# Patient Record
Sex: Male | Born: 1945 | Race: White | Hispanic: No | Marital: Married | Smoking: Former smoker
Health system: Southern US, Community
[De-identification: ages and names within clinical notes are randomized; demographics above are authoritative.]

## PROBLEM LIST (undated history)

## (undated) DIAGNOSIS — N189 Chronic kidney disease, unspecified: Secondary | ICD-10-CM

## (undated) DIAGNOSIS — K259 Gastric ulcer, unspecified as acute or chronic, without hemorrhage or perforation: Secondary | ICD-10-CM

## (undated) HISTORY — PX: LITHOTRIPSY: SUR834

## (undated) HISTORY — PX: BLADDER TUMOR EXCISION: SHX238

---

## 1999-02-28 ENCOUNTER — Ambulatory Visit (HOSPITAL_COMMUNITY): Admission: RE | Admit: 1999-02-28 | Discharge: 1999-02-28 | Payer: Self-pay | Admitting: Urology

## 1999-02-28 ENCOUNTER — Encounter: Payer: Self-pay | Admitting: Urology

## 1999-09-26 ENCOUNTER — Ambulatory Visit (HOSPITAL_COMMUNITY): Admission: RE | Admit: 1999-09-26 | Discharge: 1999-09-26 | Payer: Self-pay | Admitting: Gastroenterology

## 1999-09-26 ENCOUNTER — Encounter (INDEPENDENT_AMBULATORY_CARE_PROVIDER_SITE_OTHER): Payer: Self-pay

## 2002-10-10 ENCOUNTER — Encounter (INDEPENDENT_AMBULATORY_CARE_PROVIDER_SITE_OTHER): Payer: Self-pay | Admitting: Specialist

## 2002-10-10 ENCOUNTER — Ambulatory Visit (HOSPITAL_COMMUNITY): Admission: RE | Admit: 2002-10-10 | Discharge: 2002-10-10 | Payer: Self-pay | Admitting: Gastroenterology

## 2010-02-01 ENCOUNTER — Ambulatory Visit
Admission: RE | Admit: 2010-02-01 | Discharge: 2010-02-01 | Payer: Self-pay | Source: Home / Self Care | Admitting: Urology

## 2010-05-31 LAB — POCT HEMOGLOBIN-HEMACUE: Hemoglobin: 16.1 g/dL (ref 13.0–17.0)

## 2010-08-05 NOTE — Procedures (Signed)
[place]  Patient:    Brent Butler, Brent Butler                        MRN: [id number] Proc. Date: 09/26/99 Attending:  Petra Kuba, M.D. CC:         Petra Kuba, M.D.             Delorse Lek, M.D.                           Procedure Report  PROCEDURE PERFORMED:  Colonoscopy with polypectomy.  ENDOSCOPIST:  Petra Kuba, M.D.  INDICATIONS FOR PROCEDURE:  Patient with polyp on flexible sigmoidoscopy. Consent was signed after risks, benefits, methods, and options were thoroughly discussed with the patient prior to any premeds given.  MEDICATIONS USED:  Demerol 50 mg, Versed 5 mg.  DESCRIPTION OF PROCEDURE:  Rectal inspection was pertinent for external hemorrhoids, small.  Digital exam was negative.  Video colonoscope was inserted in the rectum.  An obvious small polyp was seen.  This was probably the one that Dr. Doristine Counter saw.  The scope was then easily inserted around the colon to the cecum.  This did require rolling him on his back and some left lower quadrant pressure but no other position changes.  The cecum was identified by the appendiceal orifice and the ileocecal valve.  On insertion, there was some left-sided diverticula seen but no other obvious polypoid lesions or masses.  Prep on the right side was only fair, The rest of the prep was adequate.  The scope was inserted a short ways into the terminal ileum which was normal.  Photodocumentation was obtained.  The scope was slowly withdrawn.  It took lots of washing and suctioning of the right side of the colon but some stool was still stuck to the wall.  Two ascending small polyps were seen and were carefully hot biopsied on a setting of 20/20.  The scope was further withdrawn and two additional polyps in the midtransverse were seen and were hot biopsied as well.  All those polyps were put in the first container.  The scope was further withdrawn.  Some left-sided moderate diverticula  were seen.  As the scope was withdrawn back to the rectum four small polyps were seen.  These were all snared, electrocautery applied and the polyps were suctioned through the scope and collected in the trap and put in the second container.  One polyp did require hot biopsying of the edge x 2 to make sure we removed it completely.  The scope was retroflexed pertinent for some internal hemorrhoids.  The scope was straightened, air was suctioned, scope removed.  The patient tolerated the procedure well.  There was no obvious immediate complication.  ENDOSCOPIC DIAGNOSIS: 1. Internal and external hemorrhoids. 2. Left-sided diverticula. 3. Four rectal polyps status post snare and hot biopsies at one of the bases.    All of these were small. 4. Four tiny to small right-sided polyps hot biopsied, put in the first    container. 4. Otherwise within normal limits to the terminal ileum.  PLAN:  Await pathology to determine future colonic screening, probably use GoLYTELY next time.  ____________ postpolypectomy instructions.  Probably repeat colon screening in three years, await on biopsy.  GI follow-up p.r.n. Otherwise return care to Dr. Doristine Counter for customary yearly rectals and guaiacs and will go ahead and give him some diverticula  information. DD:  09/26/99 TD:  09/26/99 Job: [id number]

## 2010-08-05 NOTE — Op Note (Signed)
NAME:  Brent Butler, Brent Butler                         ACCOUNT NO.:  [id number]   MEDICAL RECORD NO.:  [id number]                   PATIENT TYPE:  AMB   LOCATION:  ENDO                                 FACILITY[facility]  [facility]   PHYSICIAN:  Petra Kuba, M.D.                 DATE OF BIRTH:  [birth year]   DATE OF PROCEDURE:  10/10/2002  DATE OF DISCHARGE:                                 OPERATIVE REPORT   PROCEDURE:  Colonoscopy with polypectomy.   INDICATIONS FOR PROCEDURE:  A patient with a history of colon polyps due for  repeat screening.   Consent was signed after risks, benefits, methods, and options were  thoroughly discussed in the past.   MEDICINES USED:  Demerol 80, Versed 7.5.   DESCRIPTION OF PROCEDURE:  Rectal inspection was pertinent for external  hemorrhoids, small. Digital exam was negative. The video colonoscope was  inserted, easily advanced to the splenic flexure. At that point, a small  sessile polyp was seen and was hot biopsied x2 and put in the first  container. To advance to the cecum required rolling him on his back and  various abdominal pressures. No other abnormalities were seen on insertion.  The cecum was identified by the appendiceal orifice and the ileocecal valve.  The prep was fairly adequate. Did have a moderate amount of stool that  required washing and suctioning. On slow withdrawal through the colon, the  cecum and ascending were normal. In the more mid transverse, another small  sessile polyp was seen and was hot biopsied x2 and put in the same container  with the other polyp. The scope was slowly withdrawn back to the other  polyp. There was no obvious polypoid tissue left and the electrocautery turn  was confirmed without obvious bleeding. The scope was further withdrawn.  There was some scattered left sided diverticula but no other polyps. He had  some difficulty holding air. He also had some snoring and sleep apnea spells  but otherwise no obvious  problems and no additional findings were seen as we  withdrew back to the rectum. Anorectal pull through and retroflexion  confirmed some hemorrhoids. The scope was reinserted a short ways up the  left side of the colon, air was suctioned, scope removed. The patient  tolerated the procedure adequately. There was no obvious or immediate  complications.   ENDOSCOPIC DIAGNOSIS:  1. Internal and external hemorrhoids.  2. Left sided scattered diverticula.  3. Two small polyps hot biopsied in the splenic flexure and mid transverse.  4. Otherwise within normal limits to the cecum.    PLAN:  Await pathology to determine future colonic screening. Happy to see  back p.r.n. otherwise return care to Dr. Doristine Counter for the customary health  care maintenance to including yearly rectals and guaiacs.  Petra Kuba, M.D.    MEM/MEDQ  D:  10/10/2002  T:  10/10/2002  Job:  [id number]   cc:   Marjory Lies, M.D.  [address]  [place]  [place] [address]  Fax: [phone]

## 2010-08-05 NOTE — Consult Note (Signed)
[facility]  Patient:    Brent Butler, Brent Butler                        MRN: [id number] Proc. Date: 09/26/99 Attending:  Petra Kuba, M.D. CC:         Petra Kuba, M.D.             Delorse Lek, M.D.                          Consultation Report  HISTORY OF PRESENT ILLNESS:  A patient with a bout of diverticulitis 6 weeks ago underwent screening flex sig today and a polyp was found. Other than the bout that responded to antibiotics, he has not had any GI symptoms and has not seen any blood.  PAST MEDICAL HISTORY:  Pertinent for increased cholesterol and a kidney stone requiring surgery but no other chronic medical problems.  MEDICINES:  Niacin only.  ALLERGIES:  No known allergies.  FAMILY HISTORY:  Pertinent for a grandfather with colon cancer and his dad with polyps.  SOCIAL HISTORY:  Smokes, rarely drinks. He does not use any aspirin products.  PHYSICAL EXAMINATION:  VITAL SIGNS:  See chart. In no acute distress.  LUNGS:  Clear.  HEART:  Regular rate and rhythm.  ABDOMEN:  Soft, nontender.  ASSESSMENT:  Polyp on flex sig in a patient with a family history with colon cancer and colon polyps.  PLAN:  The risks and benefits of colonoscopy were discussed and will proceed with further workup plans and future screening pending pathology, number of polyps, etc. DD:  09/26/99 TD:  09/26/99 Job: [id number]

## 2011-01-04 ENCOUNTER — Other Ambulatory Visit: Payer: Self-pay | Admitting: Gastroenterology

## 2011-10-17 ENCOUNTER — Other Ambulatory Visit: Payer: Self-pay | Admitting: Urology

## 2011-10-30 ENCOUNTER — Encounter (HOSPITAL_COMMUNITY): Payer: Self-pay | Admitting: Pharmacy Technician

## 2011-11-01 ENCOUNTER — Encounter (HOSPITAL_COMMUNITY): Payer: Self-pay | Admitting: *Deleted

## 2011-11-01 NOTE — Progress Notes (Signed)
1903 Asked to bring in the blue folder,insurance card,ID,have a driver for the day, wear comfortable clothing. Asked not to take Aspirin,Advil or Toradol 72 hours prior to his procedure. Instructed to eat a light dinner 11-08-11 and take a laxative stated his doctor said take the laxative by 3 pm. To arrive for procedure at 0530.

## 2011-11-07 ENCOUNTER — Other Ambulatory Visit: Payer: Self-pay | Admitting: Urology

## 2011-11-08 NOTE — H&P (Signed)
Patient is scheduled today for ESWL. He is a patient of Dr. Campbell Lerner who was unable To perform this procedure and asked [place] to assume this patient's care. The history and physical below is from Dr. Belva Crome recent office assessment. The patient's urine culture from November 03, 2011 was negative. Active Problems Problems  1. 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 Hypertension 401.9 3. 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 PSA,Elevated 790.93 7. Urinary Tract Infection 599.0  History of Present Illness  Brent Butler returns today in f/u from a CT scan done for cytologic atypia on a recent specimen done for his history of bladder cancer.  He has just had a negative cystoscopy.   The CT shows a right renal pelvic stone without obstruction.  He had debris in his bladder and his UA looks infected today.  He has a diverticulum on the dome and a large prostate with a thick bladder wall from BOO.  He has noted cloudy urine but no dysuria.  He has had no flank pain or hematuria.   Past Medical History Problems  1. 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 History of  Microscopic Hematuria 599.72 4. 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 History of  Peptic Ulcer V12.71 7. PSA,Elevated 790.93 8. 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 History of  Pyuria 791.9 10. Tobacco Use V15.82  Surgical History Problems  1. History of  Cystoscopy With Fulguration 2. History of  Cystoscopy With Fulguration Medium Lesion (2-5cm) 3. History of  Lithotripsy  Current Meds 1. Doxazosin Mesylate 8 MG Oral Tablet; Take 1 tablet by mouth every day; Therapy: 02Apr2008 to  (Evaluate:30Jun2014)  Requested for: 05Jul2013; Last Rx:05Jul2013 2. Viagra 100 MG  Oral Tablet; Take 1 tablet by mouth as needed; Therapy: 14Jan2008 to (Last  Rx:05Jul2013)  Requested for: 05Jul2013  Allergies Medication  1. No Known Drug Allergies  Family History Problems  1. Maternal history of  Bladder Cancer V16.52 2. Maternal history of  Death In The Family Mother Bladder CancerAge 81 3. Paternal history of  Family Health Status - Father's Age 79 yrs old 4. Family history of  Family Health Status Number Of Children 2 son 5. Family history of  Urologic Disorder V18.7 kidney stones  Social History Problems  1. Alcohol Use 1 2. Caffeine Use 2 3. Former Smoker Smoked for 56yrs and stopped for 91yrs 4. Marital History - Currently Married 5. Occupation: Airline pilot  Review of Systems  Gastrointestinal: no nausea and no flank pain.  Constitutional: no fever.  Cardiovascular: no chest pain.  Respiratory: no shortness of breath.    Physical Exam Constitutional: Well nourished and well developed . No acute distress.  Pulmonary: No respiratory distress and normal respiratory rhythm and effort.  Cardiovascular: Heart rate and rhythm are normal . No peripheral edema.  Abdomen: The abdomen is soft and nontender. No masses are palpated. No CVA tenderness. No hernias are palpable. No hepatosplenomegaly noted.    Results/Data Urine [Data Includes: Last 1 Day]   30Jul2013  COLOR YELLOW   APPEARANCE CLOUDY   SPECIFIC GRAVITY 1.015   pH 6.5   GLUCOSE NEG mg/dL  BILIRUBIN NEG   KETONE NEG mg/dL  BLOOD TRACE   PROTEIN NEG mg/dL  UROBILINOGEN 0.2 mg/dL  NITRITE POS  LEUKOCYTE ESTERASE LARGE   SQUAMOUS EPITHELIAL/HPF NONE SEEN   WBC TNTC WBC/hpf  RBC 7-10 RBC/hpf  BACTERIA MANY   CRYSTALS NONE SEEN   CASTS NONE SEEN   Selected Results  BUN & CREATININE 25Jul2013 08:36AM Bjorn Pippin  SPECIMEN TYPE: BLOOD   Test Name Result Flag Reference  CREATININE 1.00 mg/dL  1.61-0.96  BUN 14 mg/dL  0-45  Est GFR, African American >89 mL/min    Est GFR, NonAfrican  American 78 mL/min    Performed at:        [facility]                      [address] [place]                      [place], [place] [address]   The estimated GFR is a calculation valid for adults (77 to 67 years old) that uses the CKD-EPI algorithm to adjust for age and sex. It is not to be used for children, pregnant women, hospitalized patients, patients on dialysis, or with rapidly changing kidney function. According to the NKDEP, eGFR >89 is normal, 60-89 shows mild impairment, 30-59 shows moderate impairment, 15-29 shows severe impairment and <15 is ESRD.   AU CT-HEMATURIA PROTOCOL 25Jul2013 12:00AM Bjorn Pippin   Test Name Result Flag Reference  ** RADIOLOGY REPORT BY [facility] ** ORIGINAL APPROVED BY: Genevive Bi, M.D. ON: 10/12/2011 10:30:15   *RADIOLOGY REPORT*  Clinical Data: Bladder cancer  CT ABDOMEN AND PELVIS WITHOUT AND WITH CONTRAST  Technique: Multidetector CT imaging of the abdomen and pelvis was performed without contrast material in one or both body regions, followed by contrast material(s) and further sections in one or both body regions.  Contrast: 125 ml Isovue  Comparison: CT 12/28/2009  Findings:  Renal: Non-IV contrast images demonstrate an 8 mm calculus within the right renal pelvis. No distal ureterolithiasis. Cortical phase imaging demonstrates no enhancing renal cortical lesion. Pyelogram phase imaging demonstrates no evidence of ureteral obstruction or right renal obstruction from the calculus. There is mild thickening in the right pelvis adjacent to the calculus. There are no filling defects with the distal ureters or collecting systems . There is some debris floating within the bladder at the urine contrast interface seen on sagittal image 94. There is a diverticulum in the dome of the bladder measuring 16 mm. There is some dependent debris within the bladder additionally.  Lung bases are clear. There is a  enhancing lesion in the anterior aspect of the central left hepatic lobe which is unchanged in size from prior and has imaging characters most consistent with a benign hemangioma. The gallbladder, pancreas, spleen, adrenal glands are normal. The stomach, small bowel, appendix, and cecum are normal. Multiple diverticula throughout the colon without acute inflammation. The rectum is normal.  Abdominal aorta normal caliber. No retroperitoneal periportal lymphadenopathy.  The prostate gland is enlarged and indents the base of the bladder. No pelvic lymphadenopathy. Review of bone windows demonstrates no aggressive osseous lesions.  IMPRESSION:  1.. No evidence of bladder cancer recurrence or metastasis. 2. Debris within the bladder with some floating at the fluid fluid interface and some dependent debris. 3. Diverticulum in the dome the bladder. 4. Nonobstructing large calculus within the right renal pelvis. 5. Prostate hypertrophy. 6. Incidental findings include the hepatic hemangioma and diverticulosis.   URINE CYTOLOGY 15Jul2013 09:47AM Bjorn Pippin  SPECIMEN TYPEBrigid Re CATCH   Test Name Result  Flag Reference  FINAL DIAGNOSIS:  A   - CELLS PRESENT SUSPICIOUS FOR MALIGNANCY. - RULE OUT UROTHELIAL NEOPLASIA. WBC's are present, Mild.  Urine: Not Otherwise Specified  1 VIAL RCVD 90CC OF YELLOW CC URI 1 SLIDE PREPARED [id number] LW  Relevant Clinical Info     PMH: MALIGNANT NEOPLASM OF THE LATERAL WALL OF THE BLADDER  PATHOLOGIST:     Reviewed by Dallas Breeding, MD, PhD, FCAP (Electronic Signature on File)  1 Container Submitted  CYTOTECHNOLOGIST:     Shamicka L. Tyler Deis; MS, CT (ASCP)   Assessment Assessed  1. Urinary Tract Infection 599.0 2. Nephrolithiasis Right 592.0   Alexie has a non-obstructing 8mm right renal pelvic stone and has a minimally symptomatic UTI.   Plan Health Maintenance (V70.0)  1. UA With REFLEX  Done: 30Jul2013 10:35AM Nephrolithiasis  (592.0)  2. Oxycodone-Acetaminophen 5-325 MG Oral Tablet; take 1 or 2 tablets q 4-6 hours prn pain;  Therapy: 30Jul2013 to (Last Rx:30Jul2013) 3. Follow-up Schedule Surgery Office  Follow-up  Requested for: 30Jul2013 Urinary Tract Infection (599.0)  4. URINE CULTURE  Requested for: 30Jul2013 Urinary Tract Infection (599.0), Nephrolithiasis (592.0)  5. Sulfamethoxazole-TMP DS 800-160 MG Oral Tablet; TAKE 1 TABLET BID; Therapy: 30Jul2013 to  (Complete:09Aug2013); Last Rx:30Jul2013   Urine culture today. I am going to start therapy and I will set him up for ESWL for the right renal pelvic stone but I will want him well under treatment for the UTI before proceeding.  I have reviewed the risks of the ESWL including bleeding, infection, renal and adjacent organ injury, obstructing fragments with need for secondary procedures, thrombotic events and sedation risks.

## 2011-11-09 ENCOUNTER — Encounter (HOSPITAL_COMMUNITY): Payer: Self-pay | Admitting: *Deleted

## 2011-11-09 ENCOUNTER — Ambulatory Visit (HOSPITAL_COMMUNITY)
Admission: RE | Admit: 2011-11-09 | Discharge: 2011-11-09 | Disposition: A | Discharge status: Home / Self Care | Payer: Medicare Other | Source: Ambulatory Visit | Attending: Urology | Admitting: Urology

## 2011-11-09 ENCOUNTER — Ambulatory Visit (HOSPITAL_COMMUNITY): Payer: Medicare Other

## 2011-11-09 ENCOUNTER — Encounter (HOSPITAL_COMMUNITY)
Admission: RE | Disposition: A | Discharge status: Home / Self Care | Payer: Self-pay | Source: Ambulatory Visit | Attending: Urology

## 2011-11-09 DIAGNOSIS — N138 Other obstructive and reflux uropathy: Secondary | ICD-10-CM | POA: Insufficient documentation

## 2011-11-09 DIAGNOSIS — I10 Essential (primary) hypertension: Secondary | ICD-10-CM | POA: Insufficient documentation

## 2011-11-09 DIAGNOSIS — N401 Enlarged prostate with lower urinary tract symptoms: Secondary | ICD-10-CM | POA: Insufficient documentation

## 2011-11-09 DIAGNOSIS — N390 Urinary tract infection, site not specified: Secondary | ICD-10-CM | POA: Insufficient documentation

## 2011-11-09 DIAGNOSIS — R972 Elevated prostate specific antigen [PSA]: Secondary | ICD-10-CM | POA: Insufficient documentation

## 2011-11-09 DIAGNOSIS — Z79899 Other long term (current) drug therapy: Secondary | ICD-10-CM | POA: Insufficient documentation

## 2011-11-09 DIAGNOSIS — Z8551 Personal history of malignant neoplasm of bladder: Secondary | ICD-10-CM | POA: Insufficient documentation

## 2011-11-09 DIAGNOSIS — N200 Calculus of kidney: Secondary | ICD-10-CM

## 2011-11-09 HISTORY — DX: Chronic kidney disease, unspecified: N18.9

## 2011-11-09 SURGERY — LITHOTRIPSY, ESWL
Anesthesia: LOCAL | Laterality: Right

## 2011-11-09 MED ORDER — CIPROFLOXACIN HCL 500 MG PO TABS
500.0000 mg | ORAL_TABLET | ORAL | Status: AC
  Administered 2011-11-09: 500 mg via ORAL
  Filled 2011-11-09: qty 1

## 2011-11-09 MED ORDER — DEXTROSE-NACL 5-0.45 % IV SOLN
INTRAVENOUS | Status: DC
  Administered 2011-11-09: 07:00:00 via INTRAVENOUS

## 2011-11-09 MED ORDER — DIPHENHYDRAMINE HCL 25 MG PO CAPS
25.0000 mg | ORAL_CAPSULE | ORAL | Status: AC
  Administered 2011-11-09: 25 mg via ORAL
  Filled 2011-11-09: qty 1

## 2011-11-09 MED ORDER — DIAZEPAM 5 MG PO TABS
10.0000 mg | ORAL_TABLET | ORAL | Status: AC
  Administered 2011-11-09: 10 mg via ORAL
  Filled 2011-11-09: qty 2

## 2011-11-09 NOTE — Op Note (Signed)
See [facility] OP note scanned into chart. 

## 2011-11-10 ENCOUNTER — Encounter (HOSPITAL_COMMUNITY): Payer: Self-pay

## 2013-09-03 ENCOUNTER — Other Ambulatory Visit: Payer: Self-pay | Admitting: Urology

## 2013-09-05 ENCOUNTER — Encounter (HOSPITAL_BASED_OUTPATIENT_CLINIC_OR_DEPARTMENT_OTHER): Payer: Self-pay | Admitting: *Deleted

## 2013-09-05 NOTE — Progress Notes (Signed)
To [facility] at 0600- Hg and Ekg on arrival.Npo after Mn.

## 2013-09-10 ENCOUNTER — Encounter (HOSPITAL_BASED_OUTPATIENT_CLINIC_OR_DEPARTMENT_OTHER): Payer: Self-pay | Admitting: Anesthesiology

## 2013-09-10 NOTE — H&P (Signed)
Active Problems Problems  1. Benign prostatic hyperplasia with urinary obstruction (600.01,599.69) 2. Elevated prostate specific antigen (PSA) (790.93) 3. 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 History of kidney stones (V13.01) 5. Hypertension (401.9) 6. 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 Pyuria (791.9)  History of Present Illness Brent Butler returns today in f/u. He was seen in May for cystoscopy but had a UTI which has since been treated. He has minimal pyuria today which is routine for him. He has had no hematuria or other voiding complaints. The Viagra is working again for him since treatment of the UTI.      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 He was given Mitomycin C then. He has had no recurrences and his last cystoscopy in May 2014 was negative and his most recent cytology in the fall was benign. He also has a history of stones and UTI's.  He has a history of BPH with BOO and an elevated PSA with prior prostatitis. HisPSA in 11/14 was 8.04 which is fairly stable.  He has declined biopsy and is voiding well on doxazosin.      Past Medical History Problems  1. Benign prostatic hyperplasia with urinary obstruction (600.01,599.69) 2. Elevated prostate specific antigen (PSA) (790.93) 3. 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 History of diverticulitis of colon (V12.79) 5. History of kidney stones (V13.01) 6. History of Impotence, psychogenic (302.72) 7. 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 History of Microscopic hematuria (599.72) 9. History of Peptic Ulcer (V12.71) 10. History of Pyuria (791.9) 11. History of Urinary Tract Infection (V13.02)  Surgical History Problems  1. History of Cystoscopy With Fulguration 2. History of Cystoscopy With Fulguration Medium Lesion (2-5cm) 3. History of Lithotripsy 4. History of Lithotripsy  Current Meds 1.  Doxazosin Mesylate 8 MG Oral Tablet; Take 1 tablet daily;  Therapy: 25Oct2013 to (Evaluate:05Sep2015); Last Rx:10Sep2014 Ordered 2. Viagra 100 MG Oral Tablet; Take 1 tablet as needed;  Therapy: 25Oct2013 to (Last Rx:25Oct2013) Ordered  Allergies Medication  1. No Known Drug Allergies  Family History Problems  1. Family history of Bladder Cancer (E99.37) : Mother 2. Family history of Death In The Family Mother : Mother   Bladder CancerAge 81 3. Family history of Family Health Status - Father's Age : Father   30 yrs old 62. Family history of Family Health Status Number Of Children   2 son 5. Family history of Urologic Disorder (V18.7)   kidney stones  Social History Problems  1. Alcohol Use   1 2. Caffeine Use   2 3. Former Smoker   Smoked for 56yrs and stopped for 34yrs 4. 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 Tobacco Use (V15.82)   smoked 1 pack a day for 35 years  Past and social history reviewed and updated.   Review of Systems  Genitourinary: no hematuria.  Gastrointestinal: no flank pain.  Constitutional: no fever.    Vitals Vital Signs [Data Includes: Last 1 Day]  Recorded: [id number] 09:33AM  Blood Pressure: 171 / 87 Temperature: 97.5 F Heart Rate: 87  Physical Exam Constitutional: Well nourished and well developed . No acute distress.  Pulmonary: No respiratory distress and normal respiratory rhythm and effort.  Cardiovascular: Heart rate and rhythm are normal . No peripheral edema.    Results/Data Urine [Data Includes: Last 1 Day]   [id number]  COLOR YELLOW  APPEARANCE CLEAR   SPECIFIC GRAVITY 1.015   pH 6.5   GLUCOSE NEG mg/dL  BILIRUBIN NEG   KETONE NEG mg/dL  BLOOD TRACE   PROTEIN TRACE mg/dL  UROBILINOGEN 0.2 mg/dL  NITRITE NEG   LEUKOCYTE ESTERASE MOD   SQUAMOUS EPITHELIAL/HPF NONE SEEN   WBC 7-10 WBC/hpf  RBC 0-2 RBC/hpf  BACTERIA NONE SEEN   CRYSTALS NONE SEEN   CASTS NONE  SEEN   Selected Results  URINE CULTURE [id number] 01:47PM Brent Butler  SOURCE : CLEAN CATCH SPECIMEN TYPE: URINE   Test Name Result Flag Reference  CULTURE, URINE Culture, Urine    ===== COLONY COUNT: =====  >=100,000 COLONIES/ML   FINAL REPORT: STAPHYLOCOCCUS SPECIES (COAGULASE NEGATIVE)  RIFAMPIN AND GENTAMICIN SHOULD NOT BE USED AS SINGLE DRUGS FOR TREATMENT OF STAPH INFECTIONS.   SENSITIVITY FOR: STAPHYLOCOCCUS SPECIES (COAGULASE NEGATIVE)    PENICILLIN                             RESISTANT      >=0.5    OXACILLIN                              RESISTANT        >=4    CEFAZOLIN                              RESISTANT               GENTAMICIN                             SENSITIVE      <=0.5    CIPROFLOXACIN                          RESISTANT        >=8    LEVOFLOXACIN                           RESISTANT        >=8    NITROFURANTOIN                         SENSITIVE       <=16    TRIMETH/SULFA                          SENSITIVE       <=10    VANCOMYCIN                             SENSITIVE          1    RIFAMPIN                               SENSITIVE      <=0.5    TETRACYCLINE                           RESISTANT       >=16    END OF REPORT   Procedure  Procedure: Cystoscopy   Indication: History  of Urothelial Carcinoma.  Informed Consent: Risks, benefits, and potential adverse events were discussed and informed consent was obtained from the patient.  Prep: The patient was prepped with betadine.  Antibiotic prophylaxis: Ciprofloxacin.  Procedure Note:  Urethral meatus:. No abnormalities.  Anterior urethra: No abnormalities.  Prostatic urethra: No abnormalities . Estimated length was 4 cm. There was visual obstruction of the prostatic urethra. The lateral and median prostatic lobes were enlarged. An enlarged intravesical median lobe was visualized.  Bladder: Visulization was clear. The ureteral orifices were in the normal anatomic position bilaterally and had clear efflux of  urine. A systematic survey of the bladder demonstrated no bladder tumors or stones. The mucosa was smooth without abnormalities. Examination of the bladder demonstrated mild trabeculation and a diverticulum located on the posterior aspect of the bladder measuring approximately 1 cm (with some erythema on the floor that is most suspicious for inflammation but CIS is a possibility). The patient tolerated the procedure well.  Complications: None.    Assessment Assessed  1. 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 Lesion of bladder (596.9) 3. Pyuria (791.9)  He has an area of erythema in a small diverticulum on the posterior wall that is worrisome for CIS vs inflammation.   Plan Health Maintenance  1. UA With REFLEX; [Do Not Release]; Status:Resulted - Requires Verification;   Done:  [id number] 09:16AM Malignant neoplasm of lateral wall of bladder  2. Follow-up Schedule Surgery Office  Follow-up  Status: Hold For - Appointment   Requested for: [phone] Pyuria  3. URINE CULTURE; Status:In Progress - Specimen/Data Collected;   Done: [phone]  I am going to set him up for a biopsy and fulguration and reviewed the risks of bleeding, infection, bladder wall injury possibly requiring surgical repair or catheter drainage, thrombotic events and anesthetic complications.   Urine culture today for the residual pyuria.   Discussion/Summary CC: Dr. Juanita Craver.

## 2013-09-10 NOTE — Anesthesia Preprocedure Evaluation (Addendum)
Anesthesia Evaluation  Patient identified by MRN, date of birth, ID band Patient awake    Reviewed: Allergy & Precautions, H&P , NPO status , Patient's Chart, lab work & pertinent test results  Airway Mallampati: II TM Distance: >3 FB Neck ROM: Full    Dental no notable dental hx. (+) Upper Dentures, Dental Advisory Given   Pulmonary former smoker,  breath sounds clear to auscultation  Pulmonary exam normal       Cardiovascular Exercise Tolerance: Good negative cardio ROS  Rhythm:Regular Rate:Normal  ECG: inferior Q waves in III and avF   Neuro/Psych negative neurological ROS  negative psych ROS   GI/Hepatic Neg liver ROS, PUD,   Endo/Other  negative endocrine ROS  Renal/GU Renal disease  negative genitourinary   Musculoskeletal negative musculoskeletal ROS (+)   Abdominal   Peds negative pediatric ROS (+)  Hematology negative hematology ROS (+)   Anesthesia Other Findings   Reproductive/Obstetrics negative OB ROS                        Anesthesia Physical Anesthesia Plan  ASA: II  Anesthesia Plan: General   Post-op Pain Management:    Induction: Intravenous  Airway Management Planned: LMA  Additional Equipment:   Intra-op Plan:   Post-operative Plan: Extubation in OR  Informed Consent: I have reviewed the patients History and Physical, chart, labs and discussed the procedure including the risks, benefits and alternatives for the proposed anesthesia with the patient or authorized representative who has indicated his/her understanding and acceptance.   Dental advisory given  Plan Discussed with: CRNA  Anesthesia Plan Comments:         Anesthesia Quick Evaluation

## 2013-09-11 ENCOUNTER — Encounter (HOSPITAL_BASED_OUTPATIENT_CLINIC_OR_DEPARTMENT_OTHER)
Admission: RE | Disposition: A | Discharge status: Home / Self Care | Payer: Self-pay | Source: Ambulatory Visit | Attending: Urology

## 2013-09-11 ENCOUNTER — Encounter (HOSPITAL_BASED_OUTPATIENT_CLINIC_OR_DEPARTMENT_OTHER): Payer: Medicare Other | Admitting: Anesthesiology

## 2013-09-11 ENCOUNTER — Ambulatory Visit (HOSPITAL_BASED_OUTPATIENT_CLINIC_OR_DEPARTMENT_OTHER)
Admission: RE | Admit: 2013-09-11 | Discharge: 2013-09-11 | Disposition: A | Discharge status: Home / Self Care | Payer: Medicare Other | Source: Ambulatory Visit | Attending: Urology | Admitting: Urology

## 2013-09-11 ENCOUNTER — Encounter (HOSPITAL_BASED_OUTPATIENT_CLINIC_OR_DEPARTMENT_OTHER): Payer: Self-pay | Admitting: *Deleted

## 2013-09-11 ENCOUNTER — Ambulatory Visit (HOSPITAL_BASED_OUTPATIENT_CLINIC_OR_DEPARTMENT_OTHER): Payer: Medicare Other | Admitting: Anesthesiology

## 2013-09-11 DIAGNOSIS — N138 Other obstructive and reflux uropathy: Secondary | ICD-10-CM | POA: Insufficient documentation

## 2013-09-11 DIAGNOSIS — Z79899 Other long term (current) drug therapy: Secondary | ICD-10-CM | POA: Insufficient documentation

## 2013-09-11 DIAGNOSIS — N139 Obstructive and reflux uropathy, unspecified: Secondary | ICD-10-CM | POA: Insufficient documentation

## 2013-09-11 DIAGNOSIS — Z8551 Personal history of malignant neoplasm of bladder: Secondary | ICD-10-CM | POA: Insufficient documentation

## 2013-09-11 DIAGNOSIS — Z87891 Personal history of nicotine dependence: Secondary | ICD-10-CM | POA: Insufficient documentation

## 2013-09-11 DIAGNOSIS — N323 Diverticulum of bladder: Secondary | ICD-10-CM | POA: Insufficient documentation

## 2013-09-11 DIAGNOSIS — I10 Essential (primary) hypertension: Secondary | ICD-10-CM | POA: Insufficient documentation

## 2013-09-11 DIAGNOSIS — N329 Bladder disorder, unspecified: Secondary | ICD-10-CM | POA: Insufficient documentation

## 2013-09-11 DIAGNOSIS — N401 Enlarged prostate with lower urinary tract symptoms: Secondary | ICD-10-CM | POA: Insufficient documentation

## 2013-09-11 HISTORY — PX: CYSTOSCOPY: SHX5120

## 2013-09-11 HISTORY — DX: Gastric ulcer, unspecified as acute or chronic, without hemorrhage or perforation: K25.9

## 2013-09-11 LAB — POCT HEMOGLOBIN-HEMACUE: Hemoglobin: 15.4 g/dL (ref 13.0–17.0)

## 2013-09-11 SURGERY — CYSTOSCOPY
Anesthesia: General | Site: Bladder

## 2013-09-11 MED ORDER — PROPOFOL 10 MG/ML IV BOLUS
INTRAVENOUS | Status: DC | PRN
  Administered 2013-09-11: 180 mg via INTRAVENOUS
  Administered 2013-09-11: 20 mg via INTRAVENOUS

## 2013-09-11 MED ORDER — ONDANSETRON HCL 4 MG/2ML IJ SOLN
INTRAMUSCULAR | Status: DC | PRN
  Administered 2013-09-11: 4 mg via INTRAVENOUS

## 2013-09-11 MED ORDER — SODIUM CHLORIDE 0.9 % IV SOLN
250.0000 mL | INTRAVENOUS | Status: DC | PRN
  Filled 2013-09-11: qty 250

## 2013-09-11 MED ORDER — SODIUM CHLORIDE 0.9 % IJ SOLN
3.0000 mL | INTRAMUSCULAR | Status: DC | PRN
  Filled 2013-09-11: qty 3

## 2013-09-11 MED ORDER — 0.9 % SODIUM CHLORIDE (POUR BTL) OPTIME
TOPICAL | Status: DC | PRN
  Administered 2013-09-11: 20 mL

## 2013-09-11 MED ORDER — OXYCODONE HCL 5 MG PO TABS
5.0000 mg | ORAL_TABLET | ORAL | Status: DC | PRN
  Filled 2013-09-11: qty 2

## 2013-09-11 MED ORDER — MIDAZOLAM HCL 5 MG/5ML IJ SOLN
INTRAMUSCULAR | Status: DC | PRN
  Administered 2013-09-11: 2 mg via INTRAVENOUS

## 2013-09-11 MED ORDER — ACETAMINOPHEN 650 MG RE SUPP
650.0000 mg | RECTAL | Status: DC | PRN
  Filled 2013-09-11: qty 1

## 2013-09-11 MED ORDER — PHENAZOPYRIDINE HCL 200 MG PO TABS: 200.0000 mg | ORAL_TABLET | Freq: Three times a day (TID) | ORAL | Status: DC | PRN

## 2013-09-11 MED ORDER — DEXAMETHASONE SODIUM PHOSPHATE 4 MG/ML IJ SOLN
INTRAMUSCULAR | Status: DC | PRN
  Administered 2013-09-11: 10 mg via INTRAVENOUS

## 2013-09-11 MED ORDER — KETOROLAC TROMETHAMINE 30 MG/ML IJ SOLN
INTRAMUSCULAR | Status: DC | PRN
  Administered 2013-09-11: 30 mg via INTRAVENOUS

## 2013-09-11 MED ORDER — CIPROFLOXACIN IN D5W 400 MG/200ML IV SOLN
400.0000 mg | INTRAVENOUS | Status: AC
  Administered 2013-09-11: 400 mg via INTRAVENOUS
  Filled 2013-09-11: qty 200

## 2013-09-11 MED ORDER — FENTANYL CITRATE 0.05 MG/ML IJ SOLN
INTRAMUSCULAR | Status: AC
  Filled 2013-09-11: qty 4

## 2013-09-11 MED ORDER — SODIUM CHLORIDE 0.9 % IJ SOLN
3.0000 mL | Freq: Two times a day (BID) | INTRAMUSCULAR | Status: DC
  Filled 2013-09-11: qty 3

## 2013-09-11 MED ORDER — FENTANYL CITRATE 0.05 MG/ML IJ SOLN
25.0000 ug | INTRAMUSCULAR | Status: DC | PRN
  Filled 2013-09-11: qty 1

## 2013-09-11 MED ORDER — LIDOCAINE HCL (CARDIAC) 20 MG/ML IV SOLN
INTRAVENOUS | Status: DC | PRN
  Administered 2013-09-11: 60 mg via INTRAVENOUS

## 2013-09-11 MED ORDER — PROMETHAZINE HCL 25 MG/ML IJ SOLN
6.2500 mg | INTRAMUSCULAR | Status: DC | PRN
  Filled 2013-09-11: qty 1

## 2013-09-11 MED ORDER — LACTATED RINGERS IV SOLN
INTRAVENOUS | Status: DC
  Administered 2013-09-11 (×2): via INTRAVENOUS
  Filled 2013-09-11: qty 1000

## 2013-09-11 MED ORDER — ACETAMINOPHEN 325 MG PO TABS
650.0000 mg | ORAL_TABLET | ORAL | Status: DC | PRN
  Filled 2013-09-11: qty 2

## 2013-09-11 MED ORDER — MIDAZOLAM HCL 2 MG/2ML IJ SOLN
INTRAMUSCULAR | Status: AC
  Filled 2013-09-11: qty 2

## 2013-09-11 MED ORDER — FENTANYL CITRATE 0.05 MG/ML IJ SOLN
INTRAMUSCULAR | Status: DC | PRN
Start: 2013-09-11 — End: 2013-09-11
  Administered 2013-09-11: 12.5 ug via INTRAVENOUS
  Administered 2013-09-11: 50 ug via INTRAVENOUS
  Administered 2013-09-11: 12.5 ug via INTRAVENOUS

## 2013-09-11 MED ORDER — STERILE WATER FOR IRRIGATION IR SOLN
Status: DC | PRN
  Administered 2013-09-11: 3000 mL

## 2013-09-11 SURGICAL SUPPLY — 24 items
BAG DRAIN URO-CYSTO SKYTR STRL (DRAIN) ×3 IMPLANT
CANISTER SUCT LVC 12 LTR MEDI- (MISCELLANEOUS) ×3 IMPLANT
CATH FOLEY 2WAY SLVR  5CC 16FR (CATHETERS)
CATH FOLEY 2WAY SLVR 5CC 16FR (CATHETERS) IMPLANT
CATH URET 5FR 28IN OPEN ENDED (CATHETERS) IMPLANT
CLOTH BEACON ORANGE TIMEOUT ST (SAFETY) ×3 IMPLANT
DRAPE CAMERA CLOSED 9X96 (DRAPES) ×3 IMPLANT
ELECT REM PT RETURN 9FT ADLT (ELECTROSURGICAL) ×3
ELECTRODE REM PT RTRN 9FT ADLT (ELECTROSURGICAL) ×1 IMPLANT
GLOVE SURG SS PI 7.5 STRL IVOR (GLOVE) ×6 IMPLANT
GLOVE SURG SS PI 8.0 STRL IVOR (GLOVE) ×3 IMPLANT
GOWN PREVENTION PLUS LG XLONG (DISPOSABLE) IMPLANT
GOWN STRL REIN XL XLG (GOWN DISPOSABLE) IMPLANT
GOWN STRL REUS W/TWL LRG LVL3 (GOWN DISPOSABLE) ×3 IMPLANT
GOWN STRL REUS W/TWL XL LVL3 (GOWN DISPOSABLE) ×3 IMPLANT
GUIDEWIRE STR DUAL SENSOR (WIRE) IMPLANT
NDL SAFETY ECLIPSE 18X1.5 (NEEDLE) IMPLANT
NEEDLE HYPO 18GX1.5 SHARP (NEEDLE)
NEEDLE HYPO 22GX1.5 SAFETY (NEEDLE) IMPLANT
NS IRRIG 500ML POUR BTL (IV SOLUTION) ×3 IMPLANT
PACK CYSTOSCOPY (CUSTOM PROCEDURE TRAY) ×3 IMPLANT
SYR 20CC LL (SYRINGE) IMPLANT
SYR BULB IRRIGATION 50ML (SYRINGE) ×3 IMPLANT
WATER STERILE IRR 3000ML UROMA (IV SOLUTION) ×3 IMPLANT

## 2013-09-11 NOTE — Anesthesia Procedure Notes (Signed)
Procedure Name: LMA Insertion Date/Time: 09/11/2013 7:30 AM Performed by: Mechele Claude Pre-anesthesia Checklist: Patient identified, Emergency Drugs available, Suction available and Patient being monitored Patient Re-evaluated:Patient Re-evaluated prior to inductionOxygen Delivery Method: Circle System Utilized Preoxygenation: Pre-oxygenation with 100% oxygen Intubation Type: IV induction Ventilation: Mask ventilation without difficulty LMA: LMA inserted LMA Size: 4.0 Number of attempts: 1 Airway Equipment and Method: bite block Placement Confirmation: positive ETCO2 Tube secured with: Tape Dental Injury: Teeth and Oropharynx as per pre-operative assessment

## 2013-09-11 NOTE — Brief Op Note (Signed)
09/11/2013  8:00 AM  PATIENT:  Brent Butler  68 y.o. male  PRE-OPERATIVE DIAGNOSIS:  BLADDER LESION   POST-OPERATIVE DIAGNOSIS:  BLADDER LESION   PROCEDURE:  Procedure(s): CYSTOSCOPY BLADDER BIOPSY WITH FULGERATION  (N/A)  SURGEON:  Surgeon(s) and Role:    * Malka So, MD - Primary  PHYSICIAN ASSISTANT:   ASSISTANTS: none   ANESTHESIA:   general  EBL:  Total I/O In: 200 [I.V.:200] Out: -   BLOOD ADMINISTERED:none  DRAINS: none   LOCAL MEDICATIONS USED:  NONE  SPECIMEN:  Source of Specimen:  cup biopsies from dome diverticulum  DISPOSITION OF SPECIMEN:  PATHOLOGY  COUNTS:  YES  TOURNIQUET:  * No tourniquets in log *  DICTATION: .Other Dictation: Dictation Number [id number]  MRN [id number] keyed in to transcription service.   PLAN OF CARE: Discharge to home after PACU  PATIENT DISPOSITION:  PACU - hemodynamically stable.   Delay start of Pharmacological VTE agent (>24hrs) due to surgical blood loss or risk of bleeding: not applicable

## 2013-09-11 NOTE — Discharge Instructions (Signed)
°  Cystoscopy, Care After °Refer to this sheet in the next few weeks. These instructions provide you with information on caring for yourself after your procedure. Your caregiver may also give you more specific instructions. Your treatment has been planned according to current medical practices, but problems sometimes occur. Call your caregiver if you have any problems or questions after your procedure. °HOME CARE INSTRUCTIONS  °Things you can do to ease any discomfort after your procedure include: °· Drinking enough water and fluids to keep your urine clear or pale yellow. °· Taking a warm bath to relieve any burning feelings. °SEEK IMMEDIATE MEDICAL CARE IF:  °· You have an increase in blood in your urine. °· You notice blood clots in your urine. °· You have difficulty passing urine. °· You have the chills. °· You have abdominal pain. °· You have a fever or persistent symptoms for more than 2-3 days. °· You have a fever and your symptoms suddenly get worse. °MAKE SURE YOU:  °· Understand these instructions. °· Will watch your condition. °· Will get help right away if you are not doing well or get worse. °Document Released: 09/23/2004 Document Revised: 11/06/2012 Document Reviewed: 08/28/2011 °ExitCare® Patient Information ©2015 [employer]. This information is not intended to replace advice given to you by your health care provider. Make sure you discuss any questions you have with your health care provider. ° °Post Anesthesia Home Care Instructions ° °Activity: °Get plenty of rest for the remainder of the day. A responsible adult should stay with you for 24 hours following the procedure.  °For the next 24 hours, DO NOT: °-Drive a car °-Operate machinery °-Drink alcoholic beverages °-Take any medication unless instructed by your physician °-Make any legal decisions or sign important papers. ° °Meals: °Start with liquid foods such as gelatin or soup. Progress to regular foods as tolerated. Avoid greasy, spicy,  heavy foods. If nausea and/or vomiting occur, drink only clear liquids until the nausea and/or vomiting subsides. Call your physician if vomiting continues. ° °Special Instructions/Symptoms: °Your throat may feel dry or sore from the anesthesia or the breathing tube placed in your throat during surgery. If this causes discomfort, gargle with warm salt water. The discomfort should disappear within 24 hours. ° °

## 2013-09-11 NOTE — Interval H&P Note (Signed)
History and Physical Interval Note:  09/11/2013 7:18 AM  Brent Butler  has presented today for surgery, with the diagnosis of BLADDER LESION   The various methods of treatment have been discussed with the patient and family. After consideration of risks, benefits and other options for treatment, the patient has consented to  Procedure(s): [place]  (N/A) as a surgical intervention .  The patient's history has been reviewed, patient examined, no change in status, stable for surgery.  I have reviewed the patient's chart and labs.  Questions were answered to the patient's satisfaction.     WRENN,JOHN J

## 2013-09-11 NOTE — Transfer of Care (Signed)
Immediate Anesthesia Transfer of Care Note  Patient: Brent Butler  Procedure(s) Performed: Procedure(s) (LRB): CYSTOSCOPY, FLEXIBLE CYSTOSCOPY,  BLADDER BIOPSY WITH FULGERATION  (N/A)  Patient Location: PACU  Anesthesia Type: General  Level of Consciousness:sleepy  Airway & Oxygen Therapy: Patient Spontanous Breathing and Patient connected to face mask oxygen, oral airway remains  Post-op Assessment: Report given to PACU RN and Post -op Vital signs reviewed and stable  Post vital signs: Reviewed and stable  Complications: No apparent anesthesia complications

## 2013-09-11 NOTE — Anesthesia Postprocedure Evaluation (Signed)
  Anesthesia Post-op Note  Patient: Brent Butler  Procedure(s) Performed: Procedure(s) (LRB): CYSTOSCOPY, FLEXIBLE CYSTOSCOPY,  BLADDER BIOPSY WITH FULGERATION  (N/A)  Patient Location: PACU  Anesthesia Type: General  Level of Consciousness: awake and alert   Airway and Oxygen Therapy: Patient Spontanous Breathing  Post-op Pain: mild  Post-op Assessment: Post-op Vital signs reviewed, Patient's Cardiovascular Status Stable, Respiratory Function Stable, Patent Airway and No signs of Nausea or vomiting  Last Vitals:  Filed Vitals:   09/11/13 1020  BP: 163/89  Pulse: 73  Temp: 35.9 C  Resp: 18    Post-op Vital Signs: stable   Complications: No apparent anesthesia complications

## 2013-09-11 NOTE — Op Note (Signed)
NAME:  Brent Butler, Brent Butler NO.:  [id number]  MEDICAL RECORD NO.:  [id number]  LOCATION:                                 FACILITY:  PHYSICIAN:  Marshall Cork. Jeffie Pollock, M.D.    DATE OF BIRTH:  1945-07-08  DATE OF PROCEDURE:  09/11/2013 DATE OF DISCHARGE:  09/11/2013                              OPERATIVE REPORT   PROCEDURE:  Cystoscopy with bladder biopsy and fulguration.  PREOPERATIVE DIAGNOSIS:  Bladder lesion.  POSTOPERATIVE DIAGNOSIS:  Bladder lesion.  SURGEON:  Marshall Cork. Jeffie Pollock, M.D.  ANESTHESIA:  General.  SPECIMEN:  Bladder biopsies.  BLOOD LOSS:  None.  DRAINS:  None.  COMPLICATIONS:  None.  INDICATIONS:  [name] is a 68 year old white male with a history of bladder tumors who was recently found on surveillance cystoscopy to have an area of erythematous mucosa and a diverticulum on the posterior dome. It was felt that biopsy was indicated.  FINDINGS AND PROCEDURE:  He was taken to the operating room where he was given Cipro, general anesthetic was induced.  He was placed in lithotomy position.  His perineum and genitalia were prepped with Betadine solution.  He was draped in usual sterile fashion.  Cystoscopy was performed using a 22-French scope and 12 degree lens.  Examination revealed a normal urethra.  The external sphincter was intact.  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  Examination of bladder revealed normal ureteral orifice.  The bladder was moderately trabeculated with several cellules, and there was approximately 1.5 to 2 cm diverticulum on the posterior dome to the left of midline.  Within this diverticulum, there was an area of erythematous mucosa approximately 1 x 2 cm in size.  It was worrisome for carcinoma in situ versus inflammation.  The remainder of the bladder mucosa was unremarkable.  No papillary tumors or stones were noted.  After initial cystoscopy, an attempt was  made to get into the diverticulum with the rigid scope and biopsy forceps, but due to the patient's anatomy, this is not successful.  I then used a 17-French flexible scope and was able to get in the diverticulum and used a small biopsy forceps to obtain 4 small biopsies from the abnormal mucosa.  These biopsies were placed in saline.  The biopsy site and surrounding area was then fulgurated with a Bugbee electrode.  Once hemostasis was achieved and the lesion was largely obliterated, the bladder was drained.  The cystoscope was removed.  The patient was taken down from lithotomy position.  His anesthetic was reversed.  He was moved to recovery room in stable condition.  There were no complications.     Marshall Cork. Jeffie Pollock, M.D.     JJW/MEDQ  D:  09/11/2013  T:  09/11/2013  Job:  [id number]

## 2013-09-12 ENCOUNTER — Encounter (HOSPITAL_BASED_OUTPATIENT_CLINIC_OR_DEPARTMENT_OTHER): Payer: Self-pay | Admitting: Urology

## 2014-03-25 DIAGNOSIS — R972 Elevated prostate specific antigen [PSA]: Secondary | ICD-10-CM | POA: Diagnosis not present

## 2014-04-01 DIAGNOSIS — R3912 Poor urinary stream: Secondary | ICD-10-CM | POA: Diagnosis not present

## 2014-04-01 DIAGNOSIS — N390 Urinary tract infection, site not specified: Secondary | ICD-10-CM | POA: Diagnosis not present

## 2014-04-01 DIAGNOSIS — N5201 Erectile dysfunction due to arterial insufficiency: Secondary | ICD-10-CM | POA: Diagnosis not present

## 2014-04-01 DIAGNOSIS — R972 Elevated prostate specific antigen [PSA]: Secondary | ICD-10-CM | POA: Diagnosis not present

## 2014-04-01 DIAGNOSIS — N401 Enlarged prostate with lower urinary tract symptoms: Secondary | ICD-10-CM | POA: Diagnosis not present

## 2014-04-20 DIAGNOSIS — Z13220 Encounter for screening for lipoid disorders: Secondary | ICD-10-CM | POA: Diagnosis not present

## 2014-04-20 DIAGNOSIS — Z136 Encounter for screening for cardiovascular disorders: Secondary | ICD-10-CM | POA: Diagnosis not present

## 2014-04-20 DIAGNOSIS — C679 Malignant neoplasm of bladder, unspecified: Secondary | ICD-10-CM | POA: Diagnosis not present

## 2014-04-20 DIAGNOSIS — Z0000 Encounter for general adult medical examination without abnormal findings: Secondary | ICD-10-CM | POA: Diagnosis not present

## 2014-10-01 DIAGNOSIS — N390 Urinary tract infection, site not specified: Secondary | ICD-10-CM | POA: Diagnosis not present

## 2014-10-01 DIAGNOSIS — N138 Other obstructive and reflux uropathy: Secondary | ICD-10-CM | POA: Diagnosis not present

## 2014-10-01 DIAGNOSIS — N401 Enlarged prostate with lower urinary tract symptoms: Secondary | ICD-10-CM | POA: Diagnosis not present

## 2014-10-08 DIAGNOSIS — N401 Enlarged prostate with lower urinary tract symptoms: Secondary | ICD-10-CM | POA: Diagnosis not present

## 2014-10-08 DIAGNOSIS — N323 Diverticulum of bladder: Secondary | ICD-10-CM | POA: Diagnosis not present

## 2014-10-08 DIAGNOSIS — R3912 Poor urinary stream: Secondary | ICD-10-CM | POA: Diagnosis not present

## 2014-10-08 DIAGNOSIS — N138 Other obstructive and reflux uropathy: Secondary | ICD-10-CM | POA: Diagnosis not present

## 2014-10-08 DIAGNOSIS — C672 Malignant neoplasm of lateral wall of bladder: Secondary | ICD-10-CM | POA: Diagnosis not present

## 2015-01-13 DIAGNOSIS — Z23 Encounter for immunization: Secondary | ICD-10-CM | POA: Diagnosis not present

## 2015-02-22 DIAGNOSIS — H25013 Cortical age-related cataract, bilateral: Secondary | ICD-10-CM | POA: Diagnosis not present

## 2015-02-22 DIAGNOSIS — H2513 Age-related nuclear cataract, bilateral: Secondary | ICD-10-CM | POA: Diagnosis not present

## 2015-03-23 DIAGNOSIS — R972 Elevated prostate specific antigen [PSA]: Secondary | ICD-10-CM | POA: Diagnosis not present

## 2015-04-14 DIAGNOSIS — H2511 Age-related nuclear cataract, right eye: Secondary | ICD-10-CM | POA: Diagnosis not present

## 2015-04-14 DIAGNOSIS — H25011 Cortical age-related cataract, right eye: Secondary | ICD-10-CM | POA: Diagnosis not present

## 2015-04-21 DIAGNOSIS — H2511 Age-related nuclear cataract, right eye: Secondary | ICD-10-CM | POA: Diagnosis not present

## 2015-04-21 DIAGNOSIS — H25011 Cortical age-related cataract, right eye: Secondary | ICD-10-CM | POA: Diagnosis not present

## 2015-04-22 DIAGNOSIS — H2512 Age-related nuclear cataract, left eye: Secondary | ICD-10-CM | POA: Diagnosis not present

## 2015-04-22 DIAGNOSIS — H25012 Cortical age-related cataract, left eye: Secondary | ICD-10-CM | POA: Diagnosis not present

## 2015-04-28 DIAGNOSIS — H2512 Age-related nuclear cataract, left eye: Secondary | ICD-10-CM | POA: Diagnosis not present

## 2015-04-28 DIAGNOSIS — H25012 Cortical age-related cataract, left eye: Secondary | ICD-10-CM | POA: Diagnosis not present

## 2015-05-03 DIAGNOSIS — N138 Other obstructive and reflux uropathy: Secondary | ICD-10-CM | POA: Diagnosis not present

## 2015-05-03 DIAGNOSIS — R3121 Asymptomatic microscopic hematuria: Secondary | ICD-10-CM | POA: Diagnosis not present

## 2015-05-03 DIAGNOSIS — N401 Enlarged prostate with lower urinary tract symptoms: Secondary | ICD-10-CM | POA: Diagnosis not present

## 2015-05-03 DIAGNOSIS — R972 Elevated prostate specific antigen [PSA]: Secondary | ICD-10-CM | POA: Diagnosis not present

## 2015-05-03 DIAGNOSIS — Z0000 Encounter for general adult medical examination without abnormal findings: Secondary | ICD-10-CM | POA: Diagnosis not present

## 2015-05-27 DIAGNOSIS — R030 Elevated blood-pressure reading, without diagnosis of hypertension: Secondary | ICD-10-CM | POA: Insufficient documentation

## 2015-05-27 DIAGNOSIS — C679 Malignant neoplasm of bladder, unspecified: Secondary | ICD-10-CM | POA: Insufficient documentation

## 2015-05-31 DIAGNOSIS — N390 Urinary tract infection, site not specified: Secondary | ICD-10-CM | POA: Diagnosis not present

## 2015-06-09 DIAGNOSIS — R3121 Asymptomatic microscopic hematuria: Secondary | ICD-10-CM | POA: Diagnosis not present

## 2015-06-09 DIAGNOSIS — N400 Enlarged prostate without lower urinary tract symptoms: Secondary | ICD-10-CM | POA: Diagnosis not present

## 2015-07-07 DIAGNOSIS — R3121 Asymptomatic microscopic hematuria: Secondary | ICD-10-CM | POA: Diagnosis not present

## 2015-07-07 DIAGNOSIS — N401 Enlarged prostate with lower urinary tract symptoms: Secondary | ICD-10-CM | POA: Diagnosis not present

## 2015-07-07 DIAGNOSIS — N138 Other obstructive and reflux uropathy: Secondary | ICD-10-CM | POA: Diagnosis not present

## 2015-07-07 DIAGNOSIS — R3912 Poor urinary stream: Secondary | ICD-10-CM | POA: Diagnosis not present

## 2015-07-07 DIAGNOSIS — Z0000 Encounter for general adult medical examination without abnormal findings: Secondary | ICD-10-CM | POA: Diagnosis not present

## 2015-07-07 DIAGNOSIS — N323 Diverticulum of bladder: Secondary | ICD-10-CM | POA: Diagnosis not present

## 2015-09-16 DIAGNOSIS — Z961 Presence of intraocular lens: Secondary | ICD-10-CM | POA: Diagnosis not present

## 2015-12-06 DIAGNOSIS — Z23 Encounter for immunization: Secondary | ICD-10-CM | POA: Diagnosis not present

## 2016-01-04 DIAGNOSIS — K635 Polyp of colon: Secondary | ICD-10-CM | POA: Diagnosis not present

## 2016-01-04 DIAGNOSIS — D124 Benign neoplasm of descending colon: Secondary | ICD-10-CM | POA: Diagnosis not present

## 2016-01-04 DIAGNOSIS — D123 Benign neoplasm of transverse colon: Secondary | ICD-10-CM | POA: Diagnosis not present

## 2016-01-04 DIAGNOSIS — K5730 Diverticulosis of large intestine without perforation or abscess without bleeding: Secondary | ICD-10-CM | POA: Diagnosis not present

## 2016-01-04 DIAGNOSIS — D126 Benign neoplasm of colon, unspecified: Secondary | ICD-10-CM | POA: Diagnosis not present

## 2016-01-04 DIAGNOSIS — K648 Other hemorrhoids: Secondary | ICD-10-CM | POA: Diagnosis not present

## 2016-01-04 DIAGNOSIS — K644 Residual hemorrhoidal skin tags: Secondary | ICD-10-CM | POA: Diagnosis not present

## 2016-01-04 DIAGNOSIS — D122 Benign neoplasm of ascending colon: Secondary | ICD-10-CM | POA: Diagnosis not present

## 2016-01-04 DIAGNOSIS — Z86010 Personal history of colonic polyps: Secondary | ICD-10-CM | POA: Diagnosis not present

## 2016-01-06 DIAGNOSIS — D126 Benign neoplasm of colon, unspecified: Secondary | ICD-10-CM | POA: Diagnosis not present

## 2016-01-06 DIAGNOSIS — K635 Polyp of colon: Secondary | ICD-10-CM | POA: Diagnosis not present

## 2016-06-13 DIAGNOSIS — S93402A Sprain of unspecified ligament of left ankle, initial encounter: Secondary | ICD-10-CM | POA: Diagnosis not present

## 2016-06-27 DIAGNOSIS — Z8551 Personal history of malignant neoplasm of bladder: Secondary | ICD-10-CM | POA: Diagnosis not present

## 2016-06-27 DIAGNOSIS — R970 Elevated carcinoembryonic antigen [CEA]: Secondary | ICD-10-CM | POA: Diagnosis not present

## 2016-07-05 DIAGNOSIS — Z8551 Personal history of malignant neoplasm of bladder: Secondary | ICD-10-CM | POA: Diagnosis not present

## 2016-07-05 DIAGNOSIS — N5201 Erectile dysfunction due to arterial insufficiency: Secondary | ICD-10-CM | POA: Diagnosis not present

## 2016-07-05 DIAGNOSIS — R972 Elevated prostate specific antigen [PSA]: Secondary | ICD-10-CM | POA: Diagnosis not present

## 2016-07-05 DIAGNOSIS — R3912 Poor urinary stream: Secondary | ICD-10-CM | POA: Diagnosis not present

## 2016-07-05 DIAGNOSIS — N401 Enlarged prostate with lower urinary tract symptoms: Secondary | ICD-10-CM | POA: Diagnosis not present

## 2016-07-05 DIAGNOSIS — R970 Elevated carcinoembryonic antigen [CEA]: Secondary | ICD-10-CM | POA: Diagnosis not present

## 2016-09-04 ENCOUNTER — Ambulatory Visit (INDEPENDENT_AMBULATORY_CARE_PROVIDER_SITE_OTHER): Payer: Medicare Other | Admitting: Family Medicine

## 2016-09-04 ENCOUNTER — Encounter: Payer: Self-pay | Admitting: Family Medicine

## 2016-09-04 ENCOUNTER — Ambulatory Visit (INDEPENDENT_AMBULATORY_CARE_PROVIDER_SITE_OTHER): Payer: Medicare Other

## 2016-09-04 VITALS — BP 146/88 | HR 108 | Temp 98.2°F | Ht 67.0 in | Wt 200.2 lb

## 2016-09-04 DIAGNOSIS — N529 Male erectile dysfunction, unspecified: Secondary | ICD-10-CM

## 2016-09-04 DIAGNOSIS — M25572 Pain in left ankle and joints of left foot: Secondary | ICD-10-CM

## 2016-09-04 DIAGNOSIS — E669 Obesity, unspecified: Secondary | ICD-10-CM | POA: Diagnosis not present

## 2016-09-04 DIAGNOSIS — M7989 Other specified soft tissue disorders: Secondary | ICD-10-CM | POA: Diagnosis not present

## 2016-09-04 NOTE — Progress Notes (Signed)
Brent Butler is a 71 y.o. male is here to [facility].   Patient Care Team: Briscoe Deutscher, DO as PCP - General (Family Medicine)   History of Present Illness:   Brent Butler CMA acting as scribe for Dr. Juleen China.  Ankle Pain   The incident occurred more than 1 week ago. The incident occurred at home. The injury mechanism was a twisting injury. The pain is present in the left ankle. The quality of the pain is described as aching. The pain is at a severity of 1/10. The pain is mild. The pain has been fluctuating since onset. Associated symptoms comments: None. [name] Kitchen He reports no foreign bodies present. The symptoms are aggravated by weight bearing. He has tried ice and acetaminophen for the symptoms. The treatment provided mild relief.   Health Maintenance Due  Topic Date Due  . Hepatitis C Screening  Jul 21, 1945  . TETANUS/TDAP  02/17/1964  . COLONOSCOPY  02/17/1995  . PNA vac Low Risk Adult (1 of 2 - PCV13) 02/16/2010   PMHx, SurgHx, SocialHx, Medications, and Allergies were reviewed in the Visit Navigator and updated as appropriate.   Past Medical History:  Diagnosis Date  . Chronic kidney disease   . Ulcer of gastric fundus    Past Surgical History:  Procedure Laterality Date  . BLADDER TUMOR EXCISION    . CYSTOSCOPY N/A 09/11/2013   Procedure: CYSTOSCOPY, FLEXIBLE CYSTOSCOPY,  BLADDER BIOPSY WITH FULGERATION ;  Surgeon: Malka So, MD;  Location: [facility] [address];  Service: Urology;  Laterality: N/A;  . LITHOTRIPSY     No family history on file. Social History  Substance Use Topics  . Smoking status: Former Smoker    Quit date: 09/06/2002  . Smokeless tobacco: Former Systems developer    Quit date: 11/19/1998  . Alcohol use Yes   Current Medications and Allergies:   Current Outpatient Prescriptions:  .  doxazosin (CARDURA) 8 MG tablet, Take 8 mg by mouth at bedtime., Disp: , Rfl:  .  sildenafil (VIAGRA) 100 MG tablet, Take 50 mg by mouth daily as needed. Takes 1/2  tablet as needed for erectile dysfunction, Disp: , Rfl:   No Known Allergies   Review of Systems:   Review of Systems  Constitutional: Negative for chills, fever and malaise/fatigue.  HENT: Negative for ear pain, sinus pain and sore throat.   Eyes: Negative for blurred vision and double vision.  Respiratory: Negative for cough, shortness of breath and wheezing.   Cardiovascular: Negative for chest pain, palpitations and leg swelling.  Gastrointestinal: Negative for abdominal pain, nausea and vomiting.  Musculoskeletal: Positive for joint pain. Negative for back pain and neck pain.       Left Ankle.   Neurological: Negative for dizziness and headaches.  Psychiatric/Behavioral: Negative for depression, hallucinations and memory loss.   Vitals:   Vitals:   09/04/16 1402  BP: (!) 146/88  Pulse: (!) 108  Temp: 98.2 F (36.8 C)  TempSrc: Oral  SpO2: 96%  Weight: 200 lb 3.2 oz (90.8 kg)  Height: 5\' 7"  (1.702 m)     Body mass index is 31.36 kg/m.  Physical Exam:   Physical Exam  Constitutional: He is oriented to person, place, and time. He appears well-developed and well-nourished. No distress.  HENT:  Head: Normocephalic and atraumatic.  Right Ear: External ear normal.  Left Ear: External ear normal.  Nose: Nose normal.  Mouth/Throat: Oropharynx is clear and moist.  Eyes: Conjunctivae and EOM are normal. Pupils are  equal, round, and reactive to light.  Neck: Normal range of motion. Neck supple.  Cardiovascular: Normal rate, regular rhythm, normal heart sounds and intact distal pulses.   Pulmonary/Chest: Effort normal and breath sounds normal.  Abdominal: Soft. Bowel sounds are normal.  Musculoskeletal:       Left ankle: Tenderness. Lateral malleolus tenderness found.  Neurological: He is alert and oriented to person, place, and time.  Skin: Skin is warm and dry.  Psychiatric: He has a normal mood and affect. His behavior is normal. Judgment and thought content normal.    Nursing note and vitals reviewed.  CLINICAL DATA:  Left ankle pain.  EXAM: LEFT ANKLE - 2 VIEW  COMPARISON:  No recent.  FINDINGS: Soft tissue swelling. No acute bony abnormality identified. Mild degenerative change.  IMPRESSION: Soft tissue swelling. Mild degenerative change. No acute bony abnormality .  Assessment and Plan:   [name] was seen today for establish care.  Diagnoses and all orders for this visit:  Acute left ankle pain Comments: Sprain. To PT for strengthening.  Orders: -     DG Ankle 2 Views Left; Future -     Ambulatory referral to Physical Therapy  Erectile dysfunction, unspecified erectile dysfunction type Comments: Okay to refill medication when needed.  Obesity (BMI 30-39.9) Comments: The patient is asked to make an attempt to improve diet and exercise patterns to aid in medical management of this problem.    . Reviewed expectations re: course of current medical issues. . Discussed self-management of symptoms. . Outlined signs and symptoms indicating need for more acute intervention. . Patient verbalized understanding and all questions were answered. [name] Kitchen Health Maintenance issues including appropriate healthy diet, exercise, and smoking avoidance were discussed with patient. . See orders for this visit as documented in the electronic medical record. . Patient received an After Visit Summary.  CMA served as Education administrator during this visit. History, Physical, and Plan performed by medical provider. The above documentation has been reviewed and is accurate and complete. Briscoe Deutscher, D.O.  Briscoe Deutscher, DO [place] [place] 09/07/2016  Future Appointments Date Time Provider [place]  09/12/2016 8:45 AM [facility] SUB THERAPIST A [facility] None  03/06/2017 11:30 AM Briscoe Deutscher, DO [facility] None

## 2016-09-07 ENCOUNTER — Encounter: Payer: Self-pay | Admitting: Family Medicine

## 2016-09-07 DIAGNOSIS — E669 Obesity, unspecified: Secondary | ICD-10-CM | POA: Insufficient documentation

## 2016-09-07 DIAGNOSIS — N400 Enlarged prostate without lower urinary tract symptoms: Secondary | ICD-10-CM | POA: Insufficient documentation

## 2016-09-07 DIAGNOSIS — N529 Male erectile dysfunction, unspecified: Secondary | ICD-10-CM | POA: Insufficient documentation

## 2016-09-08 ENCOUNTER — Telehealth: Payer: Self-pay | Admitting: Family Medicine

## 2016-09-08 NOTE — Telephone Encounter (Signed)
Left pt message asking to call Allison back directly at [phone] to schedule AWV. Thanks! °

## 2016-09-12 ENCOUNTER — Ambulatory Visit (INDEPENDENT_AMBULATORY_CARE_PROVIDER_SITE_OTHER): Payer: Medicare Other | Admitting: Physical Therapy

## 2016-09-12 DIAGNOSIS — M791 Myalgia, unspecified site: Secondary | ICD-10-CM

## 2016-09-12 DIAGNOSIS — R2689 Other abnormalities of gait and mobility: Secondary | ICD-10-CM | POA: Diagnosis not present

## 2016-09-12 DIAGNOSIS — M25572 Pain in left ankle and joints of left foot: Secondary | ICD-10-CM

## 2016-09-12 NOTE — Therapy (Signed)
[facility] [address] [place], [place], [address] Phone: [phone]   Fax:  [phone]  Physical Therapy Evaluation  Patient Details  Name: Brent Butler MRN: [id number] Date of Birth: 10-24-1945 Referring Provider: Dr. Briscoe Deutscher  Encounter Date: 09/12/2016      PT End of Session - 09/12/16 0927    Visit Number 1   Number of Visits 12   Date for PT Re-Evaluation 10/24/16   Authorization Type Medicare/AARP   PT Start Time 0847   PT Stop Time 0915   PT Time Calculation (min) 28 min   Activity Tolerance Patient tolerated treatment well   Behavior During Therapy [facility] for tasks assessed/performed      Past Medical History:  Diagnosis Date  . Chronic kidney disease   . Ulcer of gastric fundus     Past Surgical History:  Procedure Laterality Date  . BLADDER TUMOR EXCISION    . CYSTOSCOPY N/A 09/11/2013   Procedure: CYSTOSCOPY, FLEXIBLE CYSTOSCOPY,  BLADDER BIOPSY WITH FULGERATION ;  Surgeon: Malka So, MD;  Location: [facility]  Service: Urology;  Laterality: N/A;  . LITHOTRIPSY      There were no vitals filed for this visit.       Subjective Assessment - 09/12/16 0849    Subjective Pt is a 71 y/o male who presents to OPPT s/p Lt ankle sprain 3-4 months ago.  Pt reports increased pain especially with mowing lawn and going up/down stairs.  Pt reports symptoms seem to be getting worse, especially in the morning but then subsided once up and moving.   Limitations Walking   How long can you walk comfortably? subsides after a few min of walking first thing in the morning   Diagnostic tests xrays: no fx but possible fx at initial injury   Patient Stated Goals improve pain, go up/down stairs without pain   Currently in Pain? Yes   Pain Score 0-No pain  up to 3/10   Pain Location Ankle  soleus   Pain Orientation Left   Pain Descriptors / Indicators Dull   Pain Type Chronic pain   Pain Onset More  than a month ago   Pain Frequency Intermittent   Aggravating Factors  stairs, first steps in AM   Pain Relieving Factors ice, compression sleeve            [facility] PT Assessment - 09/12/16 0853      Assessment   Medical Diagnosis Lt ankle sprain   Referring Provider Dr. Briscoe Deutscher   Onset Date/Surgical Date --  3-4 months ago   Next MD Visit 03/06/17   Prior Therapy none     Precautions   Precautions None     Restrictions   Weight Bearing Restrictions No     Balance Screen   Has the patient fallen in the past 6 months Yes   How many times? 1-when he sprained his ankle; able to catch self on car   Has the patient had a decrease in activity level because of a fear of falling?  No   Is the patient reluctant to leave their home because of a fear of falling?  No     Home Environment   Living Environment Private residence   Living Arrangements Spouse/significant other   Type of [place] to enter   Entrance Stairs-Number of Steps 3   Entrance Stairs-Rails None   Home Layout Two level;Bed/bath upstairs  Alternate Level Stairs-Number of Steps 13   Alternate Level Stairs-Rails Right   Additional Comments pain with going up and down stairs     Prior Function   Level of Independence Independent   Vocation Retired   [employer] retired Educational psychologist   Leisure walking 2 miles (unable x 5 weeks), cardio exercises, travel     Cognition   Overall Cognitive Status Within Functional Limits for tasks assessed     ROM / Strength   AROM / PROM / Strength AROM;Strength     AROM   AROM Assessment Site Ankle   Right/Left Ankle Right;Left   Right Ankle Dorsiflexion 2   Right Ankle Plantar Flexion 65   Right Ankle Inversion 23   Right Ankle Eversion 10   Left Ankle Dorsiflexion 0   Left Ankle Plantar Flexion 60   Left Ankle Inversion 25   Left Ankle Eversion 20     Strength   Strength Assessment Site Ankle   Right/Left Ankle Left    Left Ankle Dorsiflexion 4/5   Left Ankle Plantar Flexion 3/5   Left Ankle Inversion 3/5   Left Ankle Eversion 4/5     Palpation   Palpation comment tenderness with active trigger points in the Lt gastroc/soleus     Ambulation/Gait   Gait Pattern Decreased stance time - left;Decreased dorsiflexion - left  Lt foot er            Objective measurements completed on examination: See above findings.          [facility] Adult PT Treatment/Exercise - 09/12/16 0853      Exercises   Exercises Ankle     Ankle Exercises: Stretches   Soleus Stretch 1 rep;30 seconds   Soleus Stretch Limitations Lt   Gastroc Stretch 1 rep;30 seconds   Gastroc Stretch Limitations Lt                PT Education - 09/12/16 0926    Education provided Yes   Education Details gastroc/soleus stretches, TDN   Person(s) Educated Patient   Methods Explanation;Demonstration;Handout   Comprehension Verbalized understanding;Need further instruction;Returned demonstration             PT Long Term Goals - 09/12/16 0936      PT LONG TERM GOAL #1   Title independent with HEP (10/24/16)   Time 6   Period Weeks   Status New     PT LONG TERM GOAL #2   Title report ability to negotiate stairs without increase in pain for improved function (10/24/16)   Time 6   Period Weeks   Status New     PT LONG TERM GOAL #3   Title demonstrate 4/5 Lt plantarflexion strength for improved function and mobility (10/24/16)   Time 6   Period Weeks   Status New                Plan - 09/12/16 0927    Clinical Impression Statement Pt is a 71 y/o male who presents to OPPT s/p Lt ankle sprain approx 3-4 months ago with residual pain affecting functional mobility.  Pt demonstrates decreased strength, flexibility and active trigger points and will benefit from PT to address these deficits listed.   Clinical Presentation Stable   Clinical Decision Making Low   Rehab Potential Good   PT Frequency 2x / week  may  be able to decrease to 1x/wk depending on progress   PT Duration 6 weeks   PT Treatment/Interventions ADLs/Self  Care Home Management;Cryotherapy;Electrical Stimulation;Iontophoresis 4mg /ml Dexamethasone;Moist Heat;Ultrasound;Therapeutic exercise;Therapeutic activities;Stair training;Gait training;Patient/family education;Manual techniques;Vasopneumatic Device;Taping;Dry needling;Passive range of motion   PT Next Visit Plan review HEP, manual and stretching; strengthening/compliant surface, TDN as needed   Consulted and Agree with Plan of Care Patient      Patient will benefit from skilled therapeutic intervention in order to improve the following deficits and impairments:  Abnormal gait, Pain, Increased fascial restricitons, Decreased strength, Difficulty walking, Impaired flexibility  Visit Diagnosis: Pain in left ankle and joints of left foot - Plan: PT plan of care cert/re-cert  Other abnormalities of gait and mobility - Plan: PT plan of care cert/re-cert  Myalgia - Plan: PT plan of care cert/re-cert      [facility] PT PB G-CODES - 2016-10-09 0940    Functional Assessment Tool Used  clinical judgement: gait abnormalities, pain with stairs   Functional Limitations Mobility: Walking and moving around   Mobility: Walking and Moving Around Current Status At least 20 percent but less than 40 percent impaired, limited or restricted   Mobility: Walking and Moving Around Goal Status [phone]) At least 1 percent but less than 20 percent impaired, limited or restricted       Problem List Patient Active Problem List   Diagnosis Date Noted  . ED (erectile dysfunction) 09/07/2016  . Obesity (BMI 30-39.9) 09/07/2016  . Malignant neoplasm of urinary bladder ([place]) 05/27/2015  . Prehypertension 05/27/2015      Laureen Abrahams, PT, DPT 2016/10/09 9:42 AM    [facility] [place], [place], [address] Phone: [phone]   Fax:   [phone]  Name: Brent Butler MRN: [id number] Date of Birth: Jun 27, 1945

## 2016-09-12 NOTE — Patient Instructions (Addendum)
ANKLE: Dorsiflexion - Standing    Place hand on wall for support. Place one leg in front of other. Keep back knee straight and press back heel into floor. Hold _30__ seconds. _3__ reps per set, _2-3__ sets per day, _7__ days per week  Copyright  VHI. All rights reserved.    Achilles Tendon Stretch    Stand with hands supported on wall, elbows slightly bent, feet parallel and both heels on floor, front knee bent, back knee straight. Slowly relax back knee until a stretch is felt in achilles tendon. Hold __30__ seconds. Perform 2-3 reps; 2-3 times per day.  Copyright  VHI. All rights reserved.     Trigger Point Dry Needling  . What is Trigger Point Dry Needling (DN)? o DN is a physical therapy technique used to treat muscle pain and dysfunction. Specifically, DN helps deactivate muscle trigger points (muscle knots).  o A thin filiform needle is used to penetrate the skin and stimulate the underlying trigger point. The goal is for a local twitch response (LTR) to occur and for the trigger point to relax. No medication of any kind is injected during the procedure.   . What Does Trigger Point Dry Needling Feel Like?  o The procedure feels different for each individual patient. Some patients report that they do not actually feel the needle enter the skin and overall the process is not painful. Very mild bleeding may occur. However, many patients feel a deep cramping in the muscle in which the needle was inserted. This is the local twitch response.   [name] Kitchen How Will I feel after the treatment? o Soreness is normal, and the onset of soreness may not occur for a few hours. Typically this soreness does not last longer than two days.  o Bruising is uncommon, however; ice can be used to decrease any possible bruising.  o In rare cases feeling tired or nauseous after the treatment is normal. In addition, your symptoms may get worse before they get better, this period will typically not last longer  than 24 hours.   . What Can I do After My Treatment? o Increase your hydration by drinking more water for the next 24 hours. o You may place ice or heat on the areas treated that have become sore, however, do not use heat on inflamed or bruised areas. Heat often brings more relief post needling. o You can continue your regular activities, but vigorous activity is not recommended initially after the treatment for 24 hours. o DN is best combined with other physical therapy such as strengthening, stretching, and other therapies.

## 2016-09-15 ENCOUNTER — Ambulatory Visit (INDEPENDENT_AMBULATORY_CARE_PROVIDER_SITE_OTHER): Payer: Medicare Other | Admitting: Physical Therapy

## 2016-09-15 DIAGNOSIS — M25572 Pain in left ankle and joints of left foot: Secondary | ICD-10-CM | POA: Diagnosis not present

## 2016-09-15 DIAGNOSIS — R2689 Other abnormalities of gait and mobility: Secondary | ICD-10-CM

## 2016-09-15 DIAGNOSIS — M791 Myalgia, unspecified site: Secondary | ICD-10-CM

## 2016-09-15 NOTE — Therapy (Signed)
[facility] [address] [place], [place], [address] Phone: [phone]   Fax:  [phone]  Physical Therapy Treatment  Patient Details  Name: Brent Butler MRN: [id number] Date of Birth: [birth year] Referring Provider: Dr. Briscoe Deutscher  Encounter Date: 09/15/2016      PT End of Session - 09/15/16 0930    Visit Number 2   Number of Visits 12   Date for PT Re-Evaluation 10/24/16   Authorization Type Medicare/AARP   PT Start Time 0846   PT Stop Time 0941   PT Time Calculation (min) 55 min   Activity Tolerance Patient tolerated treatment well   Behavior During Therapy [facility] for tasks assessed/performed      Past Medical History:  Diagnosis Date  . Chronic kidney disease   . Ulcer of gastric fundus     Past Surgical History:  Procedure Laterality Date  . BLADDER TUMOR EXCISION    . CYSTOSCOPY N/A 09/11/2013   Procedure: CYSTOSCOPY, FLEXIBLE CYSTOSCOPY,  BLADDER BIOPSY WITH FULGERATION ;  Surgeon: Malka So, MD;  Location: [facility];  Service: Urology;  Laterality: N/A;  . LITHOTRIPSY      There were no vitals filed for this visit.      Subjective Assessment - 09/15/16 0847    Subjective feeling a lot better but now feels tightness in calf is most limiting factor.  open to needling.  stretches are going well and pain mostly with going down stairs.   Patient Stated Goals improve pain, go up/down stairs without pain   Currently in Pain? No/denies                         [facility] Adult PT Treatment/Exercise - 09/15/16 0848      Ambulation/Gait   Gait Comments noted improved step length on Rt following TDN to Lt gastroc; pt reported decreased tightness after DN     Modalities   Modalities Moist Heat;Electrical Stimulation     Moist Heat Therapy   Number Minutes Moist Heat 15 Minutes   Moist Heat Location Other (comment)  Lt Corporate treasurer  Stimulation Location Lt gastroc   Electrical Stimulation Action premod   Electrical Stimulation Parameters to tolerance x 15 min   Electrical Stimulation Goals Pain     Manual Therapy   Manual Therapy Soft tissue mobilization;Myofascial release   Manual therapy comments in prone   Soft tissue mobilization Lt gastroc/soleuo   Myofascial Release Lt gastroc/soleus     Ankle Exercises: Aerobic   Stationary Bike L2 x 6 min     Ankle Exercises: Stretches   Soleus Stretch 3 reps;30 seconds   Soleus Stretch Limitations Lt   Gastroc Stretch 3 reps;30 seconds   Gastroc Stretch Limitations Lt          Trigger Point Dry Needling - 09/15/16 7510    Consent Given? Yes   Education Handout Provided Yes   Muscles Treated Lower Body Gastrocnemius   Gastrocnemius Response Twitch response elicited;Palpable increased muscle length              PT Education - 09/15/16 0930    Education provided Yes   Education Details reviewed DN   Person(s) Educated Patient   Methods Explanation   Comprehension Verbalized understanding             PT Long Term Goals - 09/12/16 2585      PT  LONG TERM GOAL #1   Title independent with HEP (10/24/16)   Time 6   Period Weeks   Status New     PT LONG TERM GOAL #2   Title report ability to negotiate stairs without increase in pain for improved function (10/24/16)   Time 6   Period Weeks   Status New     PT LONG TERM GOAL #3   Title demonstrate 4/5 Lt plantarflexion strength for improved function and mobility (10/24/16)   Time 6   Period Weeks   Status New               Plan - 09/15/16 0932    Clinical Impression Statement Pt with subjective reports of decreased tightness following DN to Lt gastroc with good twitch responses noted.  Pt demonstrated improved step length on Rt following DN indicating improved dorsiflexion with gait.  Will continue to benefit from PT to maximize function.   PT Treatment/Interventions ADLs/Self Care Home  Management;Cryotherapy;Electrical Stimulation;Iontophoresis 4mg /ml Dexamethasone;Moist Heat;Ultrasound;Therapeutic exercise;Therapeutic activities;Stair training;Gait training;Patient/family education;Manual techniques;Vasopneumatic Device;Taping;Dry needling;Passive range of motion   PT Next Visit Plan manual and stretching; strengthening/compliant surface, TDN as needed   Consulted and Agree with Plan of Care Patient      Patient will benefit from skilled therapeutic intervention in order to improve the following deficits and impairments:  Abnormal gait, Pain, Increased fascial restricitons, Decreased strength, Difficulty walking, Impaired flexibility  Visit Diagnosis: Pain in left ankle and joints of left foot  Other abnormalities of gait and mobility  Myalgia     Problem List Patient Active Problem List   Diagnosis Date Noted  . ED (erectile dysfunction) 09/07/2016  . Obesity (BMI 30-39.9) 09/07/2016  . Malignant neoplasm of urinary bladder ([place]) 05/27/2015  . Prehypertension 05/27/2015      Brent Butler, PT, DPT 09/15/16 9:56 AM   [address] [place], [place], [address] Phone: [phone]   Fax:  [phone]  Name: Brent Butler MRN: [id number] Date of Birth: 02-06-1946

## 2016-09-18 DIAGNOSIS — Z961 Presence of intraocular lens: Secondary | ICD-10-CM | POA: Diagnosis not present

## 2016-09-19 ENCOUNTER — Ambulatory Visit (INDEPENDENT_AMBULATORY_CARE_PROVIDER_SITE_OTHER): Payer: Medicare Other | Admitting: Physical Therapy

## 2016-09-19 DIAGNOSIS — M791 Myalgia, unspecified site: Secondary | ICD-10-CM

## 2016-09-19 DIAGNOSIS — R2689 Other abnormalities of gait and mobility: Secondary | ICD-10-CM | POA: Diagnosis not present

## 2016-09-19 DIAGNOSIS — M25572 Pain in left ankle and joints of left foot: Secondary | ICD-10-CM | POA: Diagnosis not present

## 2016-09-19 NOTE — Therapy (Signed)
[facility] [address] [place], [place], [address] Phone: [phone]   Fax:  [phone]  Physical Therapy Treatment  Patient Details  Name: Brent Butler MRN: [id number] Date of Birth: [birth year] Referring Provider: Dr. Briscoe Deutscher  Encounter Date: 09/19/2016      PT End of Session - 09/19/16 1508    Visit Number 3   Number of Visits 12   Date for PT Re-Evaluation 10/24/16   Authorization Type Medicare/AARP   PT Start Time 1428   PT Stop Time 1507   PT Time Calculation (min) 39 min   Activity Tolerance Patient tolerated treatment well   Behavior During Therapy [facility] for tasks assessed/performed      Past Medical History:  Diagnosis Date  . Chronic kidney disease   . Ulcer of gastric fundus     Past Surgical History:  Procedure Laterality Date  . BLADDER TUMOR EXCISION    . CYSTOSCOPY N/A 09/11/2013   Procedure: CYSTOSCOPY, FLEXIBLE CYSTOSCOPY,  BLADDER BIOPSY WITH FULGERATION ;  Surgeon: Malka So, MD;  Location[facility];  Service: Urology;  Laterality: N/A;  . LITHOTRIPSY      There were no vitals filed for this visit.      Subjective Assessment - 09/19/16 1430    Subjective doing really well; calf pain seems to be resolved.  still has some residual pain in anterior medial ankle.   Patient Stated Goals improve pain, go up/down stairs without pain   Currently in Pain? No/denies                         [facility] Adult PT Treatment/Exercise - 09/19/16 1431      Manual Therapy   Manual Therapy Joint mobilization   Joint Mobilization Lt ankle talocrural mobs grades 2-3     Ankle Exercises: Aerobic   Stationary Bike L2 x 6 min     Ankle Exercises: Stretches   Soleus Stretch 3 reps;30 seconds   Soleus Stretch Limitations Lt   Gastroc Stretch 3 reps;30 seconds   Gastroc Stretch Limitations Lt     Ankle Exercises: Standing   SLS 5x20 sec     Ankle Exercises: Seated    Towel Crunch 2 reps  DF/PF; INV/EV   Other Seated Ankle Exercises seated arch lifts x10                PT Education - 09/19/16 1508    Education provided Yes   Education Details strengthening HEP   Person(s) Educated Patient   Methods Explanation   Comprehension Verbalized understanding             PT Long Term Goals - 09/12/16 0936      PT LONG TERM GOAL #1   Title independent with HEP (10/24/16)   Time 6   Period Weeks   Status New     PT LONG TERM GOAL #2   Title report ability to negotiate stairs without increase in pain for improved function (10/24/16)   Time 6   Period Weeks   Status New     PT LONG TERM GOAL #3   Title demonstrate 4/5 Lt plantarflexion strength for improved function and mobility (10/24/16)   Time 6   Period Weeks   Status New               Plan - 09/19/16 1508    Clinical Impression Statement Pt reports resolve of anterior ankle  pain following manual therapy, and noted significant overpronation with both feet with ambulation.  Added strengthening exercises to HEP.  May benefit from orthotics due to gait deviations.   PT Treatment/Interventions ADLs/Self Care Home Management;Cryotherapy;Electrical Stimulation;Iontophoresis 4mg /ml Dexamethasone;Moist Heat;Ultrasound;Therapeutic exercise;Therapeutic activities;Stair training;Gait training;Patient/family education;Manual techniques;Vasopneumatic Device;Taping;Dry needling;Passive range of motion   PT Next Visit Plan manual and stretching; strengthening/compliant surface, TDN as needed   Consulted and Agree with Plan of Care Patient      Patient will benefit from skilled therapeutic intervention in order to improve the following deficits and impairments:  Abnormal gait, Pain, Increased fascial restricitons, Decreased strength, Difficulty walking, Impaired flexibility  Visit Diagnosis: Pain in left ankle and joints of left foot  Other abnormalities of gait and  mobility  Myalgia     Problem List Patient Active Problem List   Diagnosis Date Noted  . ED (erectile dysfunction) 09/07/2016  . Obesity (BMI 30-39.9) 09/07/2016  . Malignant neoplasm of urinary bladder ([place]) 05/27/2015  . Prehypertension 05/27/2015      Laureen Abrahams, PT, DPT 09/19/16 3:11 PM    [place], [place], [address] Phone: [phone]   Fax:  [phone]  Name: Brent Butler MRN: [id number] Date of Birth: 1945-04-01

## 2016-09-19 NOTE — Patient Instructions (Signed)
Access Code: [email or web address]  URL: [email or web address]  Date: 09/19/2016  Prepared by: Faustino Congress  Exercises  Single Leg Stance on Pillow - 5 reps - 1 sets - 20 sec hold - 1x daily - 7x weekly  Heel Raises with Counter Support - 10 reps - 1 sets - 3 sec hold - 1x daily - 7x weekly  Seated Arch Lifts - 10 reps - 1 sets - 5 sec hold - 1x daily - 7x weekly    Other Exercise:  Shuffle feet across counter; moving toes to the left then heels to the left.  Go length of counter and repeat 2 times each way.

## 2016-09-22 ENCOUNTER — Ambulatory Visit (INDEPENDENT_AMBULATORY_CARE_PROVIDER_SITE_OTHER): Payer: Medicare Other | Admitting: Physical Therapy

## 2016-09-22 DIAGNOSIS — M791 Myalgia, unspecified site: Secondary | ICD-10-CM

## 2016-09-22 DIAGNOSIS — R2689 Other abnormalities of gait and mobility: Secondary | ICD-10-CM

## 2016-09-22 DIAGNOSIS — M25572 Pain in left ankle and joints of left foot: Secondary | ICD-10-CM

## 2016-09-22 NOTE — Therapy (Signed)
[facility] [address] [place], [place], [address] Phone: [phone]   Fax:  [phone]  Physical Therapy Treatment  Patient Details  Name: Brent Butler MRN: [id number] Date of Birth: 1945/06/17 Referring Provider: Dr. Briscoe Deutscher  Encounter Date: 09/22/2016      PT End of Session - 09/22/16 0918    Visit Number 4   Number of Visits 12   Date for PT Re-Evaluation 10/24/16   Authorization Type Medicare/AARP   PT Start Time 0841   PT Stop Time 0929   PT Time Calculation (min) 48 min   Activity Tolerance Patient tolerated treatment well   Behavior During Therapy [facility] for tasks assessed/performed      Past Medical History:  Diagnosis Date  . Chronic kidney disease   . Ulcer of gastric fundus     Past Surgical History:  Procedure Laterality Date  . BLADDER TUMOR EXCISION    . CYSTOSCOPY N/A 09/11/2013   Procedure: CYSTOSCOPY, FLEXIBLE CYSTOSCOPY,  BLADDER BIOPSY WITH FULGERATION ;  Surgeon: Malka So, MD;  Location: [facility];  Service: Urology;  Laterality: N/A;  . LITHOTRIPSY      There were no vitals filed for this visit.      Subjective Assessment - 09/22/16 0844    Subjective doing well; still having some pain in back of calf in the morning.  resolves after stretches and ~15-20 min   Patient Stated Goals improve pain, go up/down stairs without pain   Currently in Pain? No/denies  3/10 this morning in Lt ankle                         [facility] Adult PT Treatment/Exercise - 09/22/16 0846      Ambulation/Gait   Gait Comments amb following dry needling with decreased tightness noted     Self-Care   Self-Care Other Self-Care Comments   Other Self-Care Comments  educated on use of tennis ball for myofascial release and to hold off on side shuffle and calf raises exercises for now.  continue arch lifts, SLS and stretches     Moist Heat Therapy   Number Minutes Moist Heat 15  Minutes   Moist Heat Location Other (comment)  Lt calf     Electrical Stimulation   Electrical Stimulation Location Lt gastroc   Electrical Stimulation Action premod   Electrical Stimulation Parameters to tolerance x 15 min   Electrical Stimulation Goals Pain     Manual Therapy   Manual Therapy Soft tissue mobilization   Soft tissue mobilization Lt gastroc/soleus     Ankle Exercises: Aerobic   Stationary Bike L2 x 6 min          Trigger Point Dry Needling - 09/22/16 9518    Consent Given? Yes   Muscles Treated Lower Body Gastrocnemius;Soleus   Gastrocnemius Response Twitch response elicited;Palpable increased muscle length   Soleus Response Twitch response elicited;Palpable increased muscle length              PT Education - 09/22/16 0918    Education provided Yes   Education Details see self care   Person(s) Educated Patient   Methods Explanation   Comprehension Verbalized understanding             PT Long Term Goals - 09/12/16 0936      PT LONG TERM GOAL #1   Title independent with HEP (10/24/16)   Time 6   Period Weeks  Status New     PT LONG TERM GOAL #2   Title report ability to negotiate stairs without increase in pain for improved function (10/24/16)   Time 6   Period Weeks   Status New     PT LONG TERM GOAL #3   Title demonstrate 4/5 Lt plantarflexion strength for improved function and mobility (10/24/16)   Time 6   Period Weeks   Status New               Plan - 09/22/16 4403    Clinical Impression Statement Pt reports continued improvement in pain and symptoms, but continues with pain in early morning which resolves after ~ 15-20 min and with stretching.  Active trigger points noted in Lt lateral soleus/gastroc so DN performed again today with main focus on soleus with good response.  Will continue to benefit from PT to maximize function.   PT Treatment/Interventions ADLs/Self Care Home Management;Cryotherapy;Electrical  Stimulation;Iontophoresis 4mg /ml Dexamethasone;Moist Heat;Ultrasound;Therapeutic exercise;Therapeutic activities;Stair training;Gait training;Patient/family education;Manual techniques;Vasopneumatic Device;Taping;Dry needling;Passive range of motion   PT Next Visit Plan reassess, manual and stretching, strengthening/compliant surface as tolerated, DN PRN   Consulted and Agree with Plan of Care Patient      Patient will benefit from skilled therapeutic intervention in order to improve the following deficits and impairments:  Abnormal gait, Pain, Increased fascial restricitons, Decreased strength, Difficulty walking, Impaired flexibility  Visit Diagnosis: Pain in left ankle and joints of left foot  Other abnormalities of gait and mobility  Myalgia     Problem List Patient Active Problem List   Diagnosis Date Noted  . ED (erectile dysfunction) 09/07/2016  . Obesity (BMI 30-39.9) 09/07/2016  . Malignant neoplasm of urinary bladder ([place]) 05/27/2015  . Prehypertension 05/27/2015      Laureen Abrahams, PT, DPT 09/22/16 9:22 AM    [facility] [place], [place], [address] Phone: [phone]   Fax:  [phone]  Name: Brent Butler MRN: [id number] Date of Birth: [birth year]

## 2016-09-27 ENCOUNTER — Ambulatory Visit (INDEPENDENT_AMBULATORY_CARE_PROVIDER_SITE_OTHER): Payer: Medicare Other | Admitting: Physical Therapy

## 2016-09-27 DIAGNOSIS — M791 Myalgia, unspecified site: Secondary | ICD-10-CM

## 2016-09-27 DIAGNOSIS — M25572 Pain in left ankle and joints of left foot: Secondary | ICD-10-CM | POA: Diagnosis not present

## 2016-09-27 DIAGNOSIS — R2689 Other abnormalities of gait and mobility: Secondary | ICD-10-CM

## 2016-09-27 NOTE — Therapy (Signed)
[facility] [address] [place], [place], [address] Phone: [phone]   Fax:  [phone]  Physical Therapy Treatment  Patient Details  Name: Brent Butler MRN: [id number] Date of Birth: Dec 23, 1945 Referring Provider: Dr. Briscoe Deutscher  Encounter Date: 09/27/2016      PT End of Session - 09/27/16 1009    Visit Number 5   Number of Visits 12   Date for PT Re-Evaluation 10/24/16   Authorization Type Medicare/AARP   PT Start Time 0930   PT Stop Time 1008   PT Time Calculation (min) 38 min   Activity Tolerance Patient tolerated treatment well   Behavior During Therapy [facility] for tasks assessed/performed      Past Medical History:  Diagnosis Date  . Chronic kidney disease   . Ulcer of gastric fundus     Past Surgical History:  Procedure Laterality Date  . BLADDER TUMOR EXCISION    . CYSTOSCOPY N/A 09/11/2013   Procedure: CYSTOSCOPY, FLEXIBLE CYSTOSCOPY,  BLADDER BIOPSY WITH FULGERATION ;  Surgeon: Malka So, MD;  Location: [facility];  Service: Urology;  Laterality: N/A;  . LITHOTRIPSY      There were no vitals filed for this visit.      Subjective Assessment - 09/27/16 0932    Subjective doing much better; still feels like he's having pain deep in the ankle with dorsiflexion stretches; pain still present in AM but improving (no pain with active movement in bed; but pain with first steps); ankle pain present with stairs in AM only now.   How long can you walk comfortably? subsides after a few min of walking first thing in the morning   Diagnostic tests xrays: no fx but possible fx at initial injury   Patient Stated Goals improve pain, go up/down stairs without pain   Currently in Pain? No/denies                         [facility] Adult PT Treatment/Exercise - 09/27/16 0935      Manual Therapy   Manual Therapy Joint mobilization   Joint Mobilization Lt ankle talocrural mobs grades 2-3      Ankle Exercises: Aerobic   Stationary Bike L3 x 6 min     Ankle Exercises: Stretches   Soleus Stretch 3 reps;30 seconds   Soleus Stretch Limitations Lt   Gastroc Stretch 3 reps;30 seconds   Gastroc Stretch Limitations Lt     Ankle Exercises: Seated   BAPS Sitting;Level 2;15 reps  DF/PF; INV/EV; CW/CCW   Other Seated Ankle Exercises ankle 4-way with red theraband x 15 reps; Lt                PT Education - 09/27/16 1005    Education provided Yes   Education Details consider orthotics to help with pronation   Person(s) Educated Patient   Methods Explanation   Comprehension Verbalized understanding             PT Long Term Goals - 09/12/16 0936      PT LONG TERM GOAL #1   Title independent with HEP (10/24/16)   Time 6   Period Weeks   Status New     PT LONG TERM GOAL #2   Title report ability to negotiate stairs without increase in pain for improved function (10/24/16)   Time 6   Period Weeks   Status New     PT LONG TERM GOAL #3  Title demonstrate 4/5 Lt plantarflexion strength for improved function and mobility (10/24/16)   Time 6   Period Weeks   Status New               Plan - 09/27/16 1010    Clinical Impression Statement Pt reports pain is continuuing to improve; but still with some tenderness and discomfort around medial malleolus (ant and post).  Continues to have significant over pronation in stance bil and feel he may benefit from custom orthotics.  Pt requesting to hold on that for now.   PT Treatment/Interventions ADLs/Self Care Home Management;Cryotherapy;Electrical Stimulation;Iontophoresis 4mg /ml Dexamethasone;Moist Heat;Ultrasound;Therapeutic exercise;Therapeutic activities;Stair training;Gait training;Patient/family education;Manual techniques;Vasopneumatic Device;Taping;Dry needling;Passive range of motion   PT Next Visit Plan reassess, manual and stretching, strengthening/compliant surface as tolerated, DN PRN   Consulted and Agree  with Plan of Care Patient      Patient will benefit from skilled therapeutic intervention in order to improve the following deficits and impairments:  Abnormal gait, Pain, Increased fascial restricitons, Decreased strength, Difficulty walking, Impaired flexibility  Visit Diagnosis: Pain in left ankle and joints of left foot  Other abnormalities of gait and mobility  Myalgia     Problem List Patient Active Problem List   Diagnosis Date Noted  . ED (erectile dysfunction) 09/07/2016  . Obesity (BMI 30-39.9) 09/07/2016  . Malignant neoplasm of urinary bladder ([place]) 05/27/2015  . Prehypertension 05/27/2015      Laureen Abrahams, PT, DPT 09/27/16 10:12 AM    [place] [place], [place], [address] Phone: [phone]   Fax:  [phone]  Name: Brent Butler MRN: [id number] Date of Birth: 1945/07/07

## 2016-10-04 ENCOUNTER — Ambulatory Visit (INDEPENDENT_AMBULATORY_CARE_PROVIDER_SITE_OTHER): Payer: Medicare Other | Admitting: Physical Therapy

## 2016-10-04 DIAGNOSIS — M25572 Pain in left ankle and joints of left foot: Secondary | ICD-10-CM

## 2016-10-04 DIAGNOSIS — R2689 Other abnormalities of gait and mobility: Secondary | ICD-10-CM

## 2016-10-04 DIAGNOSIS — M791 Myalgia, unspecified site: Secondary | ICD-10-CM

## 2016-10-04 NOTE — Therapy (Signed)
[place], [place], [address] Phone: [phone]   Fax:  [phone]  Physical Therapy Treatment/Discharge  Patient Details  Name: Brent Butler MRN: [id number] Date of Birth: 06-Jul-1945 Referring Provider: Dr. Briscoe Deutscher  Encounter Date: 10/04/2016      PT End of Session - 10/04/16 1158    Visit Number 6   Authorization Type Medicare/AARP   PT Start Time 1140   PT Stop Time 3888  d/c visit   PT Time Calculation (min) 16 min   Activity Tolerance Patient tolerated treatment well   Behavior During Therapy [facility] for tasks assessed/performed      Past Medical History:  Diagnosis Date  . Chronic kidney disease   . Ulcer of gastric fundus     Past Surgical History:  Procedure Laterality Date  . BLADDER TUMOR EXCISION    . CYSTOSCOPY N/A 09/11/2013   Procedure: CYSTOSCOPY, FLEXIBLE CYSTOSCOPY,  BLADDER BIOPSY WITH FULGERATION ;  Surgeon: Malka So, MD;  Location: [facility];  Service: Urology;  Laterality: N/A;  . LITHOTRIPSY      There were no vitals filed for this visit.      Subjective Assessment - 10/04/16 1139    Subjective doing very well; got night splint and each morning the foot and ankle is feeling better.  feels like he can d/c today.  pain resolves completely after stretches in AM and is significantly reduced overall.  first time going down stairs is 1/10; second time is no pain.   Diagnostic tests xrays: no fx but possible fx at initial injury   Patient Stated Goals improve pain, go up/down stairs without pain   Currently in Pain? No/denies            [facility] PT Assessment - 10/04/16 1150      Strength   Left Ankle Dorsiflexion 5/5   Left Ankle Plantar Flexion 5/5   Left Ankle Inversion 4/5   Left Ankle Eversion 5/5                     [facility] Adult PT Treatment/Exercise - 10/04/16 1145      Self-Care   Other Self-Care Comments  pt independent  with gastroc and soleus stretches as noted on previous sessions; reports only difficulty with maintaining balance on SLS-educated on ankle strategy and improving strength to help with balance     Ankle Exercises: Aerobic   Stationary Bike L2 x 6 min     Ankle Exercises: Seated   Other Seated Ankle Exercises seated arch lifts x10     Ankle Exercises: Standing   Heel Raises 20 reps  Lt                     PT Long Term Goals - 10/04/16 1158      PT LONG TERM GOAL #1   Title independent with HEP (10/24/16)   Status Achieved     PT LONG TERM GOAL #2   Title report ability to negotiate stairs without increase in pain for improved function (10/24/16)   Status Achieved     PT LONG TERM GOAL #3   Title demonstrate 4/5 Lt plantarflexion strength for improved function and mobility (10/24/16)   Status Achieved               Plan - 10/04/16 1159    Clinical Impression Statement Pt has met all goals and is ready for d/c from  PT today.  Reports only pain at 1/10 first thing in the morning but feels night splint has helped to decrease this pain and feels each morning is better and better.  Educated on gradual wean from night splint if pt is able to go at least 1 week wearing splint with no pain in AM.  Pt verbalized understanding and need to continue stretches to decrease risk of reinjury.   PT Treatment/Interventions ADLs/Self Care Home Management;Cryotherapy;Electrical Stimulation;Iontophoresis 17m/ml Dexamethasone;Moist Heat;Ultrasound;Therapeutic exercise;Therapeutic activities;Stair training;Gait training;Patient/family education;Manual techniques;Vasopneumatic Device;Taping;Dry needling;Passive range of motion   PT Next Visit Plan d/c PT today   Consulted and Agree with Plan of Care Patient      Patient will benefit from skilled therapeutic intervention in order to improve the following deficits and impairments:  Abnormal gait, Pain, Increased fascial restricitons, Decreased  strength, Difficulty walking, Impaired flexibility  Visit Diagnosis: Pain in left ankle and joints of left foot  Other abnormalities of gait and mobility  Myalgia       [facility] PT PB G-CODES - 0August 06, [id number]    Functional Assessment Tool Used  clinical judgement: gait abnormalities, pain with stairs   Functional Limitations Mobility: Walking and moving around   Mobility: Walking and Moving Around Goal Status [phone] At least 1 percent but less than 20 percent impaired, limited or restricted   Mobility: Walking and Moving Around Discharge Status [phone] At least 1 percent but less than 20 percent impaired, limited or restricted      Problem List Patient Active Problem List   Diagnosis Date Noted  . ED (erectile dysfunction) 09/07/2016  . Obesity (BMI 30-39.9) 09/07/2016  . Malignant neoplasm of urinary bladder ([place] 05/27/2015  . Prehypertension 05/27/2015      SLaureen Abrahams PT, DPT 0August 06, 201812:06 PM    [address] [address]Phone: [phone]  Fax:  [phone] Name: Brent TETRAULTMRN: [id number] of Birth: [birth year]     PHYSICAL THERAPY DISCHARGE SUMMARY  Visits from Start of Care: 6  Current functional level related to goals / functional outcomes: See above   Remaining deficits: Pt reports pain improving in the morning, but rates it at 1/10 with initial stairs to navigate.  Following stretches, pt is pain free for the remainder of the day.  Pt also with significant overpronation bil; and recommended orthotics but pt declined at this time.   Education / Equipment: HEP, DN, night splint, orthotic (pt declined at this time)  Plan: Patient agrees to discharge.  Patient goals were met. Patient is being discharged due to meeting the stated rehab goals.  ?????    SLaureen Abrahams PT, DPT 02018-10-610:09 PM   [place] [address]Phone: [phone] Fax: [phone]

## 2016-10-24 ENCOUNTER — Other Ambulatory Visit: Payer: Self-pay | Admitting: *Deleted

## 2016-10-27 NOTE — Telephone Encounter (Signed)
Scheduled 03/06/17 °

## 2016-11-13 ENCOUNTER — Ambulatory Visit (INDEPENDENT_AMBULATORY_CARE_PROVIDER_SITE_OTHER): Payer: Medicare Other

## 2016-11-13 ENCOUNTER — Ambulatory Visit (INDEPENDENT_AMBULATORY_CARE_PROVIDER_SITE_OTHER): Payer: Medicare Other | Admitting: Family Medicine

## 2016-11-13 ENCOUNTER — Encounter: Payer: Self-pay | Admitting: Family Medicine

## 2016-11-13 VITALS — BP 152/88 | HR 82 | Temp 97.8°F | Wt 200.4 lb

## 2016-11-13 DIAGNOSIS — S8991XA Unspecified injury of right lower leg, initial encounter: Secondary | ICD-10-CM

## 2016-11-13 DIAGNOSIS — M25561 Pain in right knee: Secondary | ICD-10-CM | POA: Diagnosis not present

## 2016-11-13 NOTE — Progress Notes (Signed)
   Subjective:    Patient ID: Brent Butler, male    DOB: 1946/01/26, 71 y.o.   MRN: [id number]  HPI This is a 71 yo male who presents today with right knee pain following a fall 3 days ago. He was mowing his lawn on a steep area and slipped on the wet grass. His right knee went under him. He elevated and iced it. Some improvement today. Pain yesterday excruciating, today 7-8/10. Pain did not interfere with sleep. He has not taken any medication for pain. Has had gastric ulcer prior and avoids NSAIDs. He is able to take acetaminophen but has not taken any.   Past Medical History:  Diagnosis Date  . Chronic kidney disease   . Ulcer of gastric fundus    Past Surgical History:  Procedure Laterality Date  . BLADDER TUMOR EXCISION    . CYSTOSCOPY N/A 09/11/2013   Procedure: CYSTOSCOPY, FLEXIBLE CYSTOSCOPY,  BLADDER BIOPSY WITH FULGERATION ;  Surgeon: Malka So, MD;  Location: [facility];  Service: Urology;  Laterality: N/A;  . LITHOTRIPSY     No family history on file. Social History  Substance Use Topics  . Smoking status: Former Smoker    Quit date: 09/06/2002  . Smokeless tobacco: Former Systems developer    Quit date: 11/19/1998  . Alcohol use Yes      Review of Systems Per HPI    Objective:   Physical Exam  Constitutional: He is oriented to person, place, and time. He appears well-developed and well-nourished. No distress.  HENT:  Head: Normocephalic and atraumatic.  Cardiovascular: Normal rate.   Pulmonary/Chest: Effort normal.  Musculoskeletal:       Right knee: He exhibits decreased range of motion (slight decreased extension) and swelling (mild generalized swelling). He exhibits no ecchymosis, no erythema, normal alignment, no LCL laxity, normal patellar mobility, no bony tenderness, normal meniscus and no MCL laxity. Tenderness (distal quadricepts) found.  Neurological: He is alert and oriented to person, place, and time.  Skin: He is not diaphoretic.  Vitals  reviewed.     BP (!) 152/88 (BP Location: Left Arm, Patient Position: Sitting, Cuff Size: Normal)   Pulse 82   Temp 97.8 F (36.6 C) (Oral)   Wt 200 lb 6.4 oz (90.9 kg)   SpO2 95%   BMI 31.39 kg/m  Wt Readings from Last 3 Encounters:  11/13/16 200 lb 6.4 oz (90.9 kg)  09/04/16 200 lb 3.2 oz (90.8 kg)  09/11/13 187 lb (84.8 kg)  Recheck BP 140/84  BP Readings from Last 3 Encounters:  11/13/16 (!) 152/88  09/04/16 (!) 146/88  09/11/13 (!) 163/89        Assessment & Plan:  1. Injury of right knee, initial encounter - with traumatic nature of pain and swelling, will get xray - DG Knee 1-2 Views Right; Future - continue to elevate, can take acetaminophen   Clarene Reamer, FNP-BC  [facility]  11/13/2016 10:39 AM

## 2016-11-13 NOTE — Patient Instructions (Signed)
We will notify you of xray results  You can take 2 tylenol every 8 to 12 hours for pain

## 2017-01-01 DIAGNOSIS — Z23 Encounter for immunization: Secondary | ICD-10-CM | POA: Diagnosis not present

## 2017-03-06 ENCOUNTER — Ambulatory Visit (INDEPENDENT_AMBULATORY_CARE_PROVIDER_SITE_OTHER): Payer: Medicare Other | Admitting: *Deleted

## 2017-03-06 ENCOUNTER — Ambulatory Visit: Payer: Medicare Other | Admitting: Family Medicine

## 2017-03-06 ENCOUNTER — Encounter: Payer: Self-pay | Admitting: *Deleted

## 2017-03-06 VITALS — BP 162/90 | HR 78 | Resp 16 | Ht 67.0 in | Wt 199.4 lb

## 2017-03-06 DIAGNOSIS — Z0000 Encounter for general adult medical examination without abnormal findings: Secondary | ICD-10-CM

## 2017-03-06 DIAGNOSIS — Z1159 Encounter for screening for other viral diseases: Secondary | ICD-10-CM

## 2017-03-06 NOTE — Progress Notes (Signed)
PCP notes:   Health maintenance: Hepatitis C Screening: future order placed.  PCV13: Patient believes he received in 2016, [facility] cannot pull history [facility] that far back. Roselyn Reef, can you try to get more information when he comes in tomorrow? Flu: Received in 12/2016  Abnormal screenings: None.   Patient concerns: None.    Nurse concerns: BP was elevated. Patient states it normally runs high at physicians office. States he checks it at home .     Next PCP appt: 03/07/17 1:00.

## 2017-03-06 NOTE — Progress Notes (Signed)
I have personally reviewed the Medicare Annual Wellness questionnaire and have noted 1. The patient's medical and social history 2. Their use of alcohol, tobacco or illicit drugs 3. Their current medications and supplements 4. The patient's functional ability including ADL's, fall risks, home safety risks and hearing or visual impairment. 5. Diet and physical activities 6. Evidence for depression or mood disorders 7. Reviewed Updated provider list, see scanned forms and [facility] Snapshot.   The patients weight, height, BMI and visual acuity have been recorded in the chart I have made referrals, counseling and provided education to the patient based review of the above and I have provided the pt with a written personalized care plan for preventive services.  I have provided the patient with a copy of your personalized plan for preventive services. Instructed to take the time to review along with their updated medication list.  Brent Butler  

## 2017-03-06 NOTE — Progress Notes (Signed)
Subjective:   Brent Butler is a 71 y.o. male who presents for an Initial Medicare Annual Wellness Visit.  Lives in two story single family house with wife.  Review of Systems  No ROS.  Medicare Wellness Visit. Additional risk factors are reflected in the social history.  Sleeps 8 hrs/night. Wakes up feeling rested.  Cardiac Risk Factors include: advanced age (>38men, >59 women);male gender;obesity (BMI >30kg/m2)    Objective:    Today's Vitals   03/06/17 1103  BP: (!) 162/90  Pulse: 78  Resp: 16  SpO2: 94%  Weight: 199 lb 6.4 oz (90.4 kg)  Height: 5\' 7"  (1.702 m)   Body mass index is 31.23 kg/m.  Advanced Directives 03/06/2017 09/12/2016 09/11/2013 11/09/2011  Does Patient Have a Medical Advance Directive? No No Patient does not have advance directive;Patient would not like information Patient does not have advance directive  Would patient like information on creating a medical advance directive? Yes (MAU/Ambulatory/Procedural Areas - Information given) No - Patient declined - -    Current Medications (verified) Outpatient Encounter Medications as of 03/06/2017  Medication Sig  . doxazosin (CARDURA) 8 MG tablet Take 8 mg by mouth at bedtime.  . sildenafil (VIAGRA) 100 MG tablet Take 50 mg by mouth daily as needed. Takes 1/2 tablet as needed for erectile dysfunction   No facility-administered encounter medications on file as of 03/06/2017.     Allergies (verified) Patient has no known allergies.   History: Past Medical History:  Diagnosis Date  . Chronic kidney disease   . Ulcer of gastric fundus    Past Surgical History:  Procedure Laterality Date  . BLADDER TUMOR EXCISION    . CYSTOSCOPY N/A 09/11/2013   Procedure: CYSTOSCOPY, FLEXIBLE CYSTOSCOPY,  BLADDER BIOPSY WITH FULGERATION ;  Surgeon: Malka So, MD;  Location: [facility];  Service: Urology;  Laterality: N/A;  . LITHOTRIPSY     History reviewed. No pertinent family  history. Social History   Socioeconomic History  . Marital status: Married    Spouse name: None  . Number of children: None  . Years of education: None  . Highest education level: None  Social Needs  . Financial resource strain: None  . Food insecurity - worry: None  . Food insecurity - inability: None  . Transportation needs - medical: None  . Transportation needs - non-medical: None  Occupational History  . Occupation: retired    Comment: Naval architect  Tobacco Use  . Smoking status: Former Smoker    Last attempt to quit: 09/06/2002    Years since quitting: 14.5  . Smokeless tobacco: Former Systems developer    Quit date: 11/19/1998  Substance and Sexual Activity  . Alcohol use: Yes    Alcohol/week: 0.6 oz    Types: 1 Glasses of wine per week  . Drug use: No  . Sexual activity: Yes  Other Topics Concern  . None  Social History Narrative  . None   Tobacco Counseling Counseling given: Not Answered  Activities of Daily Living In your present state of health, do you have any difficulty performing the following activities: 03/06/2017  Hearing? N  Vision? N  Difficulty concentrating or making decisions? N  Walking or climbing stairs? N  Dressing or bathing? N  Doing errands, shopping? N  Preparing Food and eating ? N  Using the Toilet? N  In the past six months, have you accidently leaked urine? N  Do you have problems with loss of bowel control?  N  Managing your Medications? N  Managing your Finances? N  Housekeeping or managing your Housekeeping? N  Some recent data might be hidden     Immunizations and Health Maintenance Immunization History  Administered Date(s) Administered  . Influenza, High Dose Seasonal PF 12/06/2015  . Influenza-Unspecified 12/18/2016  . Tdap 12/06/2015   Health Maintenance Due  Topic Date Due  . Hepatitis C Screening  08-31-1945  . PNA vac Low Risk Adult (1 of 2 - PCV13) 02/16/2010    Patient Care Team: Briscoe Deutscher, DO as PCP -  General (Family Medicine) Rutherford Guys, MD as Consulting Physician (Ophthalmology) [name], MD as Attending Physician (Urology) Clarene Essex, MD as Consulting Physician (Gastroenterology)  Indicate any recent Medical Services you may have received from other than [facility] providers in the past year (date may be approximate).    Assessment:   This is a routine wellness examination for Brent Butler.  Hearing/Vision screen  Hearing Screening   125Hz  250Hz  500Hz  1000Hz  2000Hz  3000Hz  4000Hz  6000Hz  8000Hz   Right ear:   40 40 40  0    Left ear:   40 40 40  0    Vision Screening Comments: 09/2016 Dr Mertie Moores  Dietary issues and exercise activities discussed: Current Exercise Habits: Home exercise routine, Type of exercise: walking(also does a lot of yard work), Time (Minutes): 40, Frequency (Times/Week): 5, Weekly Exercise (Minutes/Week): 200, Intensity: Moderate, Exercise limited by: None identified  Eats 3 meals/day. Breakfast: fruit and toast Lunch: soup or sandwich or leftovers Dinner: chicken with vegetables and pasta or potato Snacks on cookies. Drinks 1 large cup coffee. Drinks 2-3 16 oz bottles water/day. Drinks juice and soda occasionally.   Goals    . Lose 10 lbs.     Increase exercise and cut back on sweets.       Depression Screen PHQ 2/9 Scores 03/06/2017 11/13/2016  PHQ - 2 Score 0 0  PHQ- 9 Score 0 -   PHQ2 and PHQ9 completed on this patient. No signs or symptoms depression. Total time spent on this topic was 9 minutes.  Fall Risk Fall Risk  03/06/2017 11/13/2016  Falls in the past year? Yes -  Number falls in past yr: 2 or more -  Injury with Fall? Yes Yes  Risk Factor Category  High Fall Risk -  Risk for fall due to : History of fall(s) -  Follow up Education provided;Falls prevention discussed -   Cognitive Function: Ad8 score reviewed for issues:  Issues making decisions: no  Less interest in hobbies / activities:no  Repeats questions, stories (family  complaining):no  Trouble using ordinary gadgets (microwave, computer, phone):no  Forgets the month or year: no  Mismanaging finances: no  Remembering appts:no  Daily problems with thinking and/or memory:no Ad8 score is=0  Encouraged patient to participate in brain stimulating activities.       Screening Tests Health Maintenance  Topic Date Due  . Hepatitis C Screening  March 17, 1946  . PNA vac Low Risk Adult (1 of 2 - PCV13) 02/16/2010  . TETANUS/TDAP  12/05/2025  . COLONOSCOPY  01/03/2026  . INFLUENZA VACCINE  Completed      Plan:   Follow up with PCP as directed.  I have personally reviewed and noted the following in the patient's chart:   . Medical and social history . Use of alcohol, tobacco or illicit drugs  . Current medications and supplements . Functional ability and status . Nutritional status . Physical activity . Advanced directives . List  of other physicians . Vitals . Screenings to include cognitive, depression, and falls . Referrals and appointments  In addition, I have reviewed and discussed with patient certain preventive protocols, quality metrics, and best practice recommendations. A written personalized care plan for preventive services as well as general preventive health recommendations were provided to patient.     Williemae Area, RN   03/06/2017

## 2017-03-06 NOTE — Progress Notes (Signed)
Pre visit review using our clinic review tool, if applicable. No additional management support is needed unless otherwise documented below in the visit note. 

## 2017-03-06 NOTE — Patient Instructions (Addendum)
Brent Butler ,  Bring a copy of your living will and/or healthcare power of attorney to your next office visit.  Thank you for taking time to come for your Medicare Wellness Visit. I appreciate your ongoing commitment to your health goals. Please review the following plan we discussed and let me know if I can assist you in the future.   These are the goals we discussed: Goals    . Lose 10 lbs.     Increase exercise and cut back on sweets.        This is a list of the screening recommended for you and due dates:  Health Maintenance  Topic Date Due  .  Hepatitis C: One time screening is recommended by Center for Disease Control  (CDC) for  adults born from [birth year] through 1966.   [birth year]  . Pneumonia vaccines (1 of 2 - PCV13) 02/16/2010  . Tetanus Vaccine  12/05/2025  . Colon Cancer Screening  01/03/2026  . Flu Shot  Completed   Preventive Care for Adults  A healthy lifestyle and preventive care can promote health and wellness. Preventive health guidelines for adults include the following key practices.  . A routine yearly physical is a good way to check with your health care provider about your health and preventive screening. It is a chance to share any concerns and updates on your health and to receive a thorough exam.  . Visit your dentist for a routine exam and preventive care every 6 months. Brush your teeth twice a day and floss once a day. Good oral hygiene prevents tooth decay and gum disease.  . The frequency of eye exams is based on your age, health, family medical history, use  of contact lenses, and other factors. Follow your health care provider's recommendations for frequency of eye exams.  . Eat a healthy diet. Foods like vegetables, fruits, whole grains, low-fat dairy products, and lean protein foods contain the nutrients you need without too many calories. Decrease your intake of foods high in solid fats, added sugars, and salt. Eat the right amount of calories for  you. Get information about a proper diet from your health care provider, if necessary.  . Regular physical exercise is one of the most important things you can do for your health. Most adults should get at least 150 minutes of moderate-intensity exercise (any activity that increases your heart rate and causes you to sweat) each week. In addition, most adults need muscle-strengthening exercises on 2 or more days a week.  Silver Sneakers may be a benefit available to you. To determine eligibility, you may visit the website: www.silversneakers.com or contact program at 646-500-8674 Mon-Fri between 8AM-8PM.   . Maintain a healthy weight. The body mass index (BMI) is a screening tool to identify possible weight problems. It provides an estimate of body fat based on height and weight. Your health care provider can find your BMI and can help you achieve or maintain a healthy weight.   For adults 20 years and older: ? A BMI below 18.5 is considered underweight. ? A BMI of 18.5 to 24.9 is normal. ? A BMI of 25 to 29.9 is considered overweight. ? A BMI of 30 and above is considered obese.   . Maintain normal blood lipids and cholesterol levels by exercising and minimizing your intake of saturated fat. Eat a balanced diet with plenty of fruit and vegetables. Blood tests for lipids and cholesterol should begin at age 77 and be repeated every  5 years. If your lipid or cholesterol levels are high, you are over 50, or you are at high risk for heart disease, you may need your cholesterol levels checked more frequently. Ongoing high lipid and cholesterol levels should be treated with medicines if diet and exercise are not working.  . If you smoke, find out from your health care provider how to quit. If you do not use tobacco, please do not start.  . If you choose to drink alcohol, please do not consume more than 2 drinks per day. One drink is considered to be 12 ounces (355 mL) of beer, 5 ounces (148 mL) of  wine, or 1.5 ounces (44 mL) of liquor.  . If you are 44-6 years old, ask your health care provider if you should take aspirin to prevent strokes.  . Use sunscreen. Apply sunscreen liberally and repeatedly throughout the day. You should seek shade when your shadow is shorter than you. Protect yourself by wearing long sleeves, pants, a wide-brimmed hat, and sunglasses year round, whenever you are outdoors.  . Once a month, do a whole body skin exam, using a mirror to look at the skin on your back. Tell your health care provider of new moles, moles that have irregular borders, moles that are larger than a pencil eraser, or moles that have changed in shape or color.  Fall Prevention in the Home Falls can cause injuries. They can happen to people of all ages. There are many things you can do to make your home safe and to help prevent falls. What can I do on the outside of my home?  Regularly fix the edges of walkways and driveways and fix any cracks.  Remove anything that might make you trip as you walk through a door, such as a raised step or threshold.  Trim any bushes or trees on the path to your home.  Use bright outdoor lighting.  Clear any walking paths of anything that might make someone trip, such as rocks or tools.  Regularly check to see if handrails are loose or broken. Make sure that both sides of any steps have handrails.  Any raised decks and porches should have guardrails on the edges.  Have any leaves, snow, or ice cleared regularly.  Use sand or salt on walking paths during winter.  Clean up any spills in your garage right away. This includes oil or grease spills. What can I do in the bathroom?  Use night lights.  Install grab bars by the toilet and in the tub and shower. Do not use towel bars as grab bars.  Use non-skid mats or decals in the tub or shower.  If you need to sit down in the shower, use a plastic, non-slip stool.  Keep the floor dry. Clean up any  water that spills on the floor as soon as it happens.  Remove soap buildup in the tub or shower regularly.  Attach bath mats securely with double-sided non-slip rug tape.  Do not have throw rugs and other things on the floor that can make you trip. What can I do in the bedroom?  Use night lights.  Make sure that you have a light by your bed that is easy to reach.  Do not use any sheets or blankets that are too big for your bed. They should not hang down onto the floor.  Have a firm chair that has side arms. You can use this for support while you get dressed.  Do  not have throw rugs and other things on the floor that can make you trip. What can I do in the kitchen?  Clean up any spills right away.  Avoid walking on wet floors.  Keep items that you use a lot in easy-to-reach places.  If you need to reach something above you, use a strong step stool that has a grab bar.  Keep electrical cords out of the way.  Do not use floor polish or wax that makes floors slippery. If you must use wax, use non-skid floor wax.  Do not have throw rugs and other things on the floor that can make you trip. What can I do with my stairs?  Do not leave any items on the stairs.  Make sure that there are handrails on both sides of the stairs and use them. Fix handrails that are broken or loose. Make sure that handrails are as long as the stairways.  Check any carpeting to make sure that it is firmly attached to the stairs. Fix any carpet that is loose or worn.  Avoid having throw rugs at the top or bottom of the stairs. If you do have throw rugs, attach them to the floor with carpet tape.  Make sure that you have a light switch at the top of the stairs and the bottom of the stairs. If you do not have them, ask someone to add them for you. What else can I do to help prevent falls?  Wear shoes that: ? Do not have high heels. ? Have rubber bottoms. ? Are comfortable and fit you well. ? Are closed  at the toe. Do not wear sandals.  If you use a stepladder: ? Make sure that it is fully opened. Do not climb a closed stepladder. ? Make sure that both sides of the stepladder are locked into place. ? Ask someone to hold it for you, if possible.  Clearly mark and make sure that you can see: ? Any grab bars or handrails. ? First and last steps. ? Where the edge of each step is.  Use tools that help you move around (mobility aids) if they are needed. These include: ? Canes. ? Walkers. ? Scooters. ? Crutches.  Turn on the lights when you go into a dark area. Replace any light bulbs as soon as they burn out.  Set up your furniture so you have a clear path. Avoid moving your furniture around.  If any of your floors are uneven, fix them.  If there are any pets around you, be aware of where they are.  Review your medicines with your doctor. Some medicines can make you feel dizzy. This can increase your chance of falling. Ask your doctor what other things that you can do to help prevent falls. This information is not intended to replace advice given to you by your health care provider. Make sure you discuss any questions you have with your health care provider. Document Released: 12/31/2008 Document Revised: 08/12/2015 Document Reviewed: 04/10/2014 Elsevier Interactive Patient Education  [employer].

## 2017-03-07 ENCOUNTER — Telehealth: Payer: Self-pay | Admitting: Family Medicine

## 2017-03-07 ENCOUNTER — Encounter: Payer: Self-pay | Admitting: Family Medicine

## 2017-03-07 ENCOUNTER — Ambulatory Visit (INDEPENDENT_AMBULATORY_CARE_PROVIDER_SITE_OTHER): Payer: Medicare Other | Admitting: Family Medicine

## 2017-03-07 VITALS — BP 140/88 | HR 94 | Temp 98.1°F | Ht 67.0 in | Wt 199.4 lb

## 2017-03-07 DIAGNOSIS — R030 Elevated blood-pressure reading, without diagnosis of hypertension: Secondary | ICD-10-CM | POA: Diagnosis not present

## 2017-03-07 DIAGNOSIS — Z0000 Encounter for general adult medical examination without abnormal findings: Secondary | ICD-10-CM | POA: Diagnosis not present

## 2017-03-07 DIAGNOSIS — Z13220 Encounter for screening for lipoid disorders: Secondary | ICD-10-CM | POA: Diagnosis not present

## 2017-03-07 DIAGNOSIS — E669 Obesity, unspecified: Secondary | ICD-10-CM | POA: Diagnosis not present

## 2017-03-07 NOTE — Telephone Encounter (Signed)
Update per [facility].

## 2017-03-07 NOTE — Patient Instructions (Signed)
Call for an appointment (okay to work in) if your blood pressures remain elevated for more than one to two weeks.   Come back fasting for future labs.

## 2017-03-07 NOTE — Telephone Encounter (Signed)
Copied from [place] [id number]. Topic: General - Other >> Mar 07, 2017  2:47 PM Darl Householder, RMA wrote: Reason for CRM: patient is calling to notify Dr. Juleen China information concerning Pneumonia immunization, date of administration 01/13/2015 Prevnar 17

## 2017-03-07 NOTE — Telephone Encounter (Signed)
Patient states he called number from our office and states that he was given that date at [facility] on [address]. Can I go by that or do I need to call and get documentation from them?

## 2017-03-07 NOTE — Telephone Encounter (Signed)
Please update health maintenance

## 2017-03-07 NOTE — Progress Notes (Signed)
Subjective:    Brent Butler is a 71 y.o. male who presents today for his Complete Annual Exam. He feels well. He reports exercising 3 times per week. He reports he is sleeping well.   Health Maintenance Due  Topic Date Due  . Hepatitis C Screening  June 30, 1945  . PNA vac Low Risk Adult (1 of 2 - PCV13) 02/16/2010   PMHx, SurgHx, SocialHx, Medications, and Allergies were reviewed in the Visit Navigator and updated as appropriate.   Past Medical History:  Diagnosis Date  . Chronic kidney disease   . Ulcer of gastric fundus    Past Surgical History:  Procedure Laterality Date  . BLADDER TUMOR EXCISION    . CYSTOSCOPY N/A 09/11/2013   Procedure: CYSTOSCOPY, FLEXIBLE CYSTOSCOPY,  BLADDER BIOPSY WITH FULGERATION ;  Surgeon: Malka So, MD;  Location[facility];  Service: Urology;  Laterality: N/A;  . LITHOTRIPSY     No family history on file. Social History   Tobacco Use  . Smoking status: Former Smoker    Last attempt to quit: 09/06/2002    Years since quitting: 14.5  . Smokeless tobacco: Former Systems developer    Quit date: 11/19/1998  Substance Use Topics  . Alcohol use: Yes    Alcohol/week: 0.6 oz    Types: 1 Glasses of wine per week  . Drug use: No    Review of Systems:   Pertinent items are noted in the HPI. Otherwise, ROS is negative.  Objective:   Vitals:   03/07/17 1257  BP: 140/88  Pulse: 94  Temp: 98.1 F (36.7 C)  SpO2: 94%   Body mass index is 31.23 kg/m.  General  Alert, cooperative, no distress, appears stated age  Head:  Normocephalic, without obvious abnormality, atraumatic  Eyes:  PERRL, conjunctiva/corneas clear, EOM's intact, fundi benign, both eyes       Ears:  Normal TM's and external ear canals, both ears  Nose: Nares normal, septum midline, mucosa normal, no drainage or sinus tenderness  Throat: Lips, mucosa, and tongue normal; teeth and gums normal  Neck: Supple, symmetrical, trachea midline, no adenopathy; thyroid: no  enlargement/tenderness/nodules; no carotid bruit or JVD  Back:   Symmetric, no curvature, ROM normal, no CVA tenderness  Lungs:   Clear to auscultation bilaterally, respirations unlabored  Chest Wall:  No tenderness or deformity  Heart:  Regular rate and rhythm, S1 and S2 normal, no murmur, rub or gallop  Abdomen:   Soft, non-tender, bowel sounds active all four quadrants, no masses, no organomegaly  Extremities: Extremities normal, atraumatic, no cyanosis or edema  Prostate : Not done.   Skin: Skin color, texture, turgor normal, no rashes or lesions  Lymph: Cervical, supraclavicular, and axillary nodes normal  Neurologic: CNII-XII grossly intact. Normal strength, sensation and reflexes throughout    AssessmentPlan:   Diagnoses and all orders for this visit:  Routine physical examination  Elevated blood pressure reading -     Comprehensive metabolic panel; Future  Obesity (BMI 30-39.9) -     CBC with Differential/Platelet; Future -     Comprehensive metabolic panel; Future  Screening for lipid disorders -     Lipid panel; Future    Patient Counseling: [x]   Nutrition: Stressed importance of moderation in sodium/caffeine intake, saturated fat and cholesterol, caloric balance, sufficient intake of fresh fruits, vegetables, and fiber  [x]   Stressed the importance of regular exercise.   []   Substance Abuse: Discussed cessation/primary prevention of tobacco, alcohol, or other  drug use; driving or other dangerous activities under the influence; availability of treatment for abuse.   [x]   Injury prevention: Discussed safety belts, safety helmets, smoke detector, smoking near bedding or upholstery.   []   Sexuality: Discussed sexually transmitted diseases, partner selection, use of condoms, avoidance of unintended pregnancy  and contraceptive alternatives.   [x]   Dental health: Discussed importance of regular tooth brushing, flossing, and dental visits.  [x]   Health maintenance and  immunizations reviewed. Please refer to Health maintenance section.    Briscoe Deutscher, DO [place]

## 2017-03-08 NOTE — Telephone Encounter (Signed)
Updated chart.

## 2017-03-10 ENCOUNTER — Encounter: Payer: Self-pay | Admitting: Family Medicine

## 2017-03-26 ENCOUNTER — Other Ambulatory Visit (INDEPENDENT_AMBULATORY_CARE_PROVIDER_SITE_OTHER): Payer: Medicare Other

## 2017-03-26 DIAGNOSIS — Z1159 Encounter for screening for other viral diseases: Secondary | ICD-10-CM | POA: Diagnosis not present

## 2017-03-26 DIAGNOSIS — R030 Elevated blood-pressure reading, without diagnosis of hypertension: Secondary | ICD-10-CM

## 2017-03-26 DIAGNOSIS — E669 Obesity, unspecified: Secondary | ICD-10-CM

## 2017-03-26 DIAGNOSIS — Z13220 Encounter for screening for lipoid disorders: Secondary | ICD-10-CM

## 2017-03-26 LAB — CBC WITH DIFFERENTIAL/PLATELET
Basophils Absolute: 0 10*3/uL (ref 0.0–0.1)
Basophils Relative: 0.7 % (ref 0.0–3.0)
Eosinophils Absolute: 0.2 10*3/uL (ref 0.0–0.7)
Eosinophils Relative: 3.5 % (ref 0.0–5.0)
HCT: 48.3 % (ref 39.0–52.0)
Hemoglobin: 16.1 g/dL (ref 13.0–17.0)
Lymphocytes Relative: 25.9 % (ref 12.0–46.0)
Lymphs Abs: 1.4 10*3/uL (ref 0.7–4.0)
MCHC: 33.4 g/dL (ref 30.0–36.0)
MCV: 95.7 fl (ref 78.0–100.0)
Monocytes Absolute: 0.4 10*3/uL (ref 0.1–1.0)
Monocytes Relative: 6.8 % (ref 3.0–12.0)
Neutro Abs: 3.5 10*3/uL (ref 1.4–7.7)
Neutrophils Relative %: 63.1 % (ref 43.0–77.0)
Platelets: 165 10*3/uL (ref 150.0–400.0)
RBC: 5.04 Mil/uL (ref 4.22–5.81)
RDW: 13.4 % (ref 11.5–15.5)
WBC: 5.5 10*3/uL (ref 4.0–10.5)

## 2017-03-26 LAB — COMPREHENSIVE METABOLIC PANEL
ALT: 31 U/L (ref 0–53)
AST: 23 U/L (ref 0–37)
Albumin: 4.3 g/dL (ref 3.5–5.2)
Alkaline Phosphatase: 75 U/L (ref 39–117)
BUN: 15 mg/dL (ref 6–23)
CO2: 27 mEq/L (ref 19–32)
Calcium: 9.1 mg/dL (ref 8.4–10.5)
Chloride: 102 mEq/L (ref 96–112)
Creatinine, Ser: 1.1 mg/dL (ref 0.40–1.50)
GFR: 69.92 mL/min (ref >60.00)
Glucose, Bld: 102 mg/dL — ABNORMAL HIGH (ref 70–99)
Potassium: 4.1 mEq/L (ref 3.5–5.1)
Sodium: 136 mEq/L (ref 135–145)
Total Bilirubin: 0.8 mg/dL (ref 0.2–1.2)
Total Protein: 6.7 g/dL (ref 6.0–8.3)

## 2017-03-26 LAB — LIPID PANEL
Cholesterol: 219 mg/dL — ABNORMAL HIGH (ref 0–200)
HDL: 34.5 mg/dL — ABNORMAL LOW (ref >39.00)
LDL Cholesterol: 146 mg/dL — ABNORMAL HIGH (ref 0–99)
NonHDL: 184.22
Total CHOL/HDL Ratio: 6
Triglycerides: 193 mg/dL — ABNORMAL HIGH (ref 0.0–149.0)
VLDL: 38.6 mg/dL (ref 0.0–40.0)

## 2017-03-27 LAB — HEPATITIS C ANTIBODY
Hepatitis C Ab: NONREACTIVE
SIGNAL TO CUT-OFF: 0.01 (ref <1.00)

## 2017-03-29 MED ORDER — SIMVASTATIN 20 MG PO TABS: 20.0000 mg | ORAL_TABLET | Freq: Every evening | ORAL | 3 refills | Status: DC

## 2017-03-29 NOTE — Addendum Note (Signed)
Addended by: Briscoe Deutscher R on: 03/29/2017 11:00 AM   Modules accepted: Orders

## 2017-06-29 ENCOUNTER — Other Ambulatory Visit: Payer: Self-pay

## 2017-06-29 MED ORDER — SIMVASTATIN 20 MG PO TABS: 20.0000 mg | ORAL_TABLET | Freq: Every evening | ORAL | 3 refills | Status: DC

## 2017-07-02 DIAGNOSIS — R972 Elevated prostate specific antigen [PSA]: Secondary | ICD-10-CM | POA: Diagnosis not present

## 2017-07-11 DIAGNOSIS — Z8551 Personal history of malignant neoplasm of bladder: Secondary | ICD-10-CM | POA: Diagnosis not present

## 2017-07-11 DIAGNOSIS — N401 Enlarged prostate with lower urinary tract symptoms: Secondary | ICD-10-CM | POA: Diagnosis not present

## 2017-07-11 DIAGNOSIS — R3912 Poor urinary stream: Secondary | ICD-10-CM | POA: Diagnosis not present

## 2017-10-04 DIAGNOSIS — Z961 Presence of intraocular lens: Secondary | ICD-10-CM | POA: Diagnosis not present

## 2017-11-13 IMAGING — DX DG KNEE 1-2V*R*
2 series · 2 of 2 positions shown · non-contrast
Comparison: None.

CLINICAL DATA: Status post fall.

EXAM:
RIGHT KNEE - 1-2 VIEW

[knee standing ap]
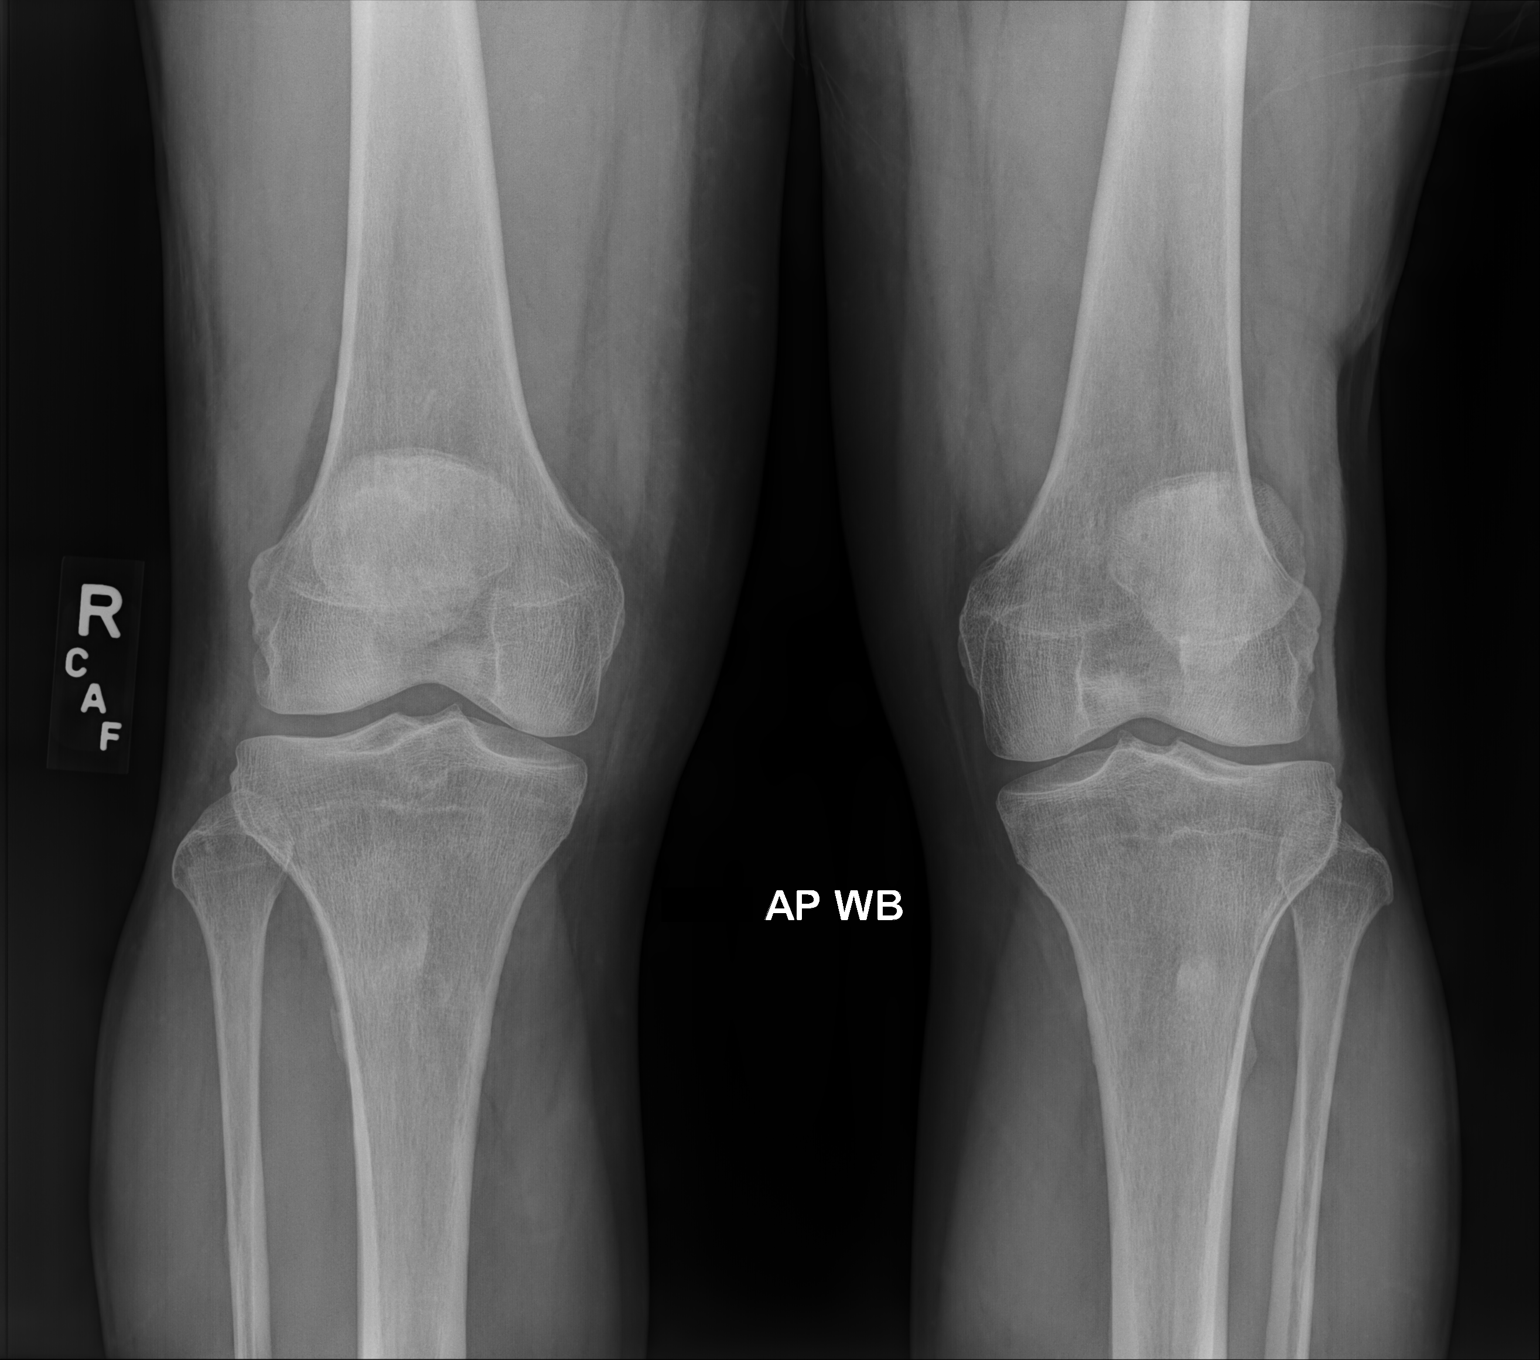

[knee standing lat]
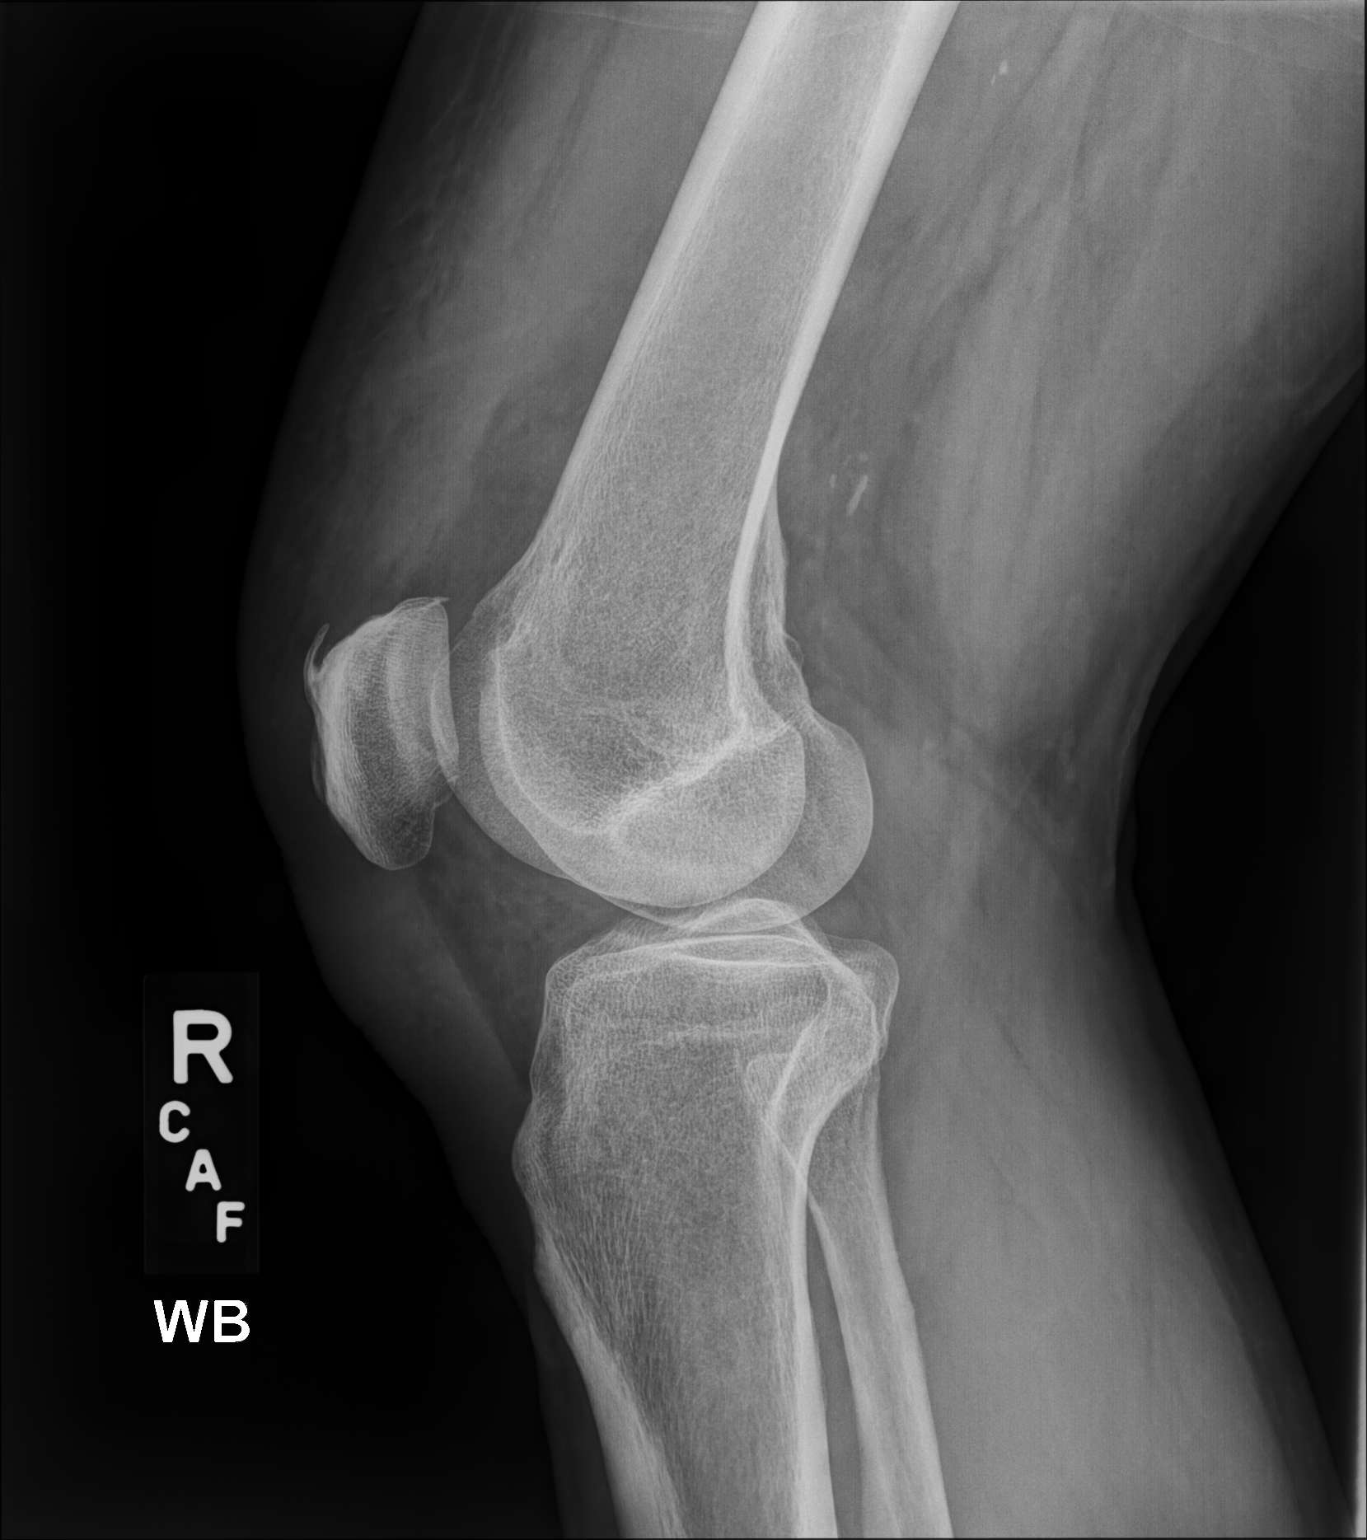

[2 of 2 positions shown; findings below may reference images not displayed]

FINDINGS: Bones:  No fracture or dislocation.  Normal bone mineralization.

Joints: Normal alignment. No erosive changes. Joint spaces are
maintained. No significant joint effusion.

Soft tissue: Irregularity of the distal quadriceps muscle-tendon
junction which may reflect soft tissue injury. No radiopaque foreign
body. No subcutaneous emphysema.
IMPRESSION: 1.  No acute osseous injury of the right knee.
2. Irregularity of the distal quadriceps muscle-tendon junction
which may reflect soft tissue injury.

## 2017-12-24 DIAGNOSIS — Z23 Encounter for immunization: Secondary | ICD-10-CM | POA: Diagnosis not present

## 2018-05-26 ENCOUNTER — Other Ambulatory Visit: Payer: Self-pay | Admitting: Family Medicine

## 2018-07-11 DIAGNOSIS — R972 Elevated prostate specific antigen [PSA]: Secondary | ICD-10-CM | POA: Diagnosis not present

## 2018-07-18 DIAGNOSIS — R972 Elevated prostate specific antigen [PSA]: Secondary | ICD-10-CM | POA: Diagnosis not present

## 2018-07-18 DIAGNOSIS — Z8551 Personal history of malignant neoplasm of bladder: Secondary | ICD-10-CM | POA: Diagnosis not present

## 2018-07-18 DIAGNOSIS — N401 Enlarged prostate with lower urinary tract symptoms: Secondary | ICD-10-CM | POA: Diagnosis not present

## 2018-07-18 DIAGNOSIS — N5201 Erectile dysfunction due to arterial insufficiency: Secondary | ICD-10-CM | POA: Diagnosis not present

## 2018-07-18 DIAGNOSIS — R3121 Asymptomatic microscopic hematuria: Secondary | ICD-10-CM | POA: Diagnosis not present

## 2018-07-25 ENCOUNTER — Other Ambulatory Visit: Payer: Self-pay | Admitting: Family Medicine

## 2018-08-01 DIAGNOSIS — N3000 Acute cystitis without hematuria: Secondary | ICD-10-CM | POA: Diagnosis not present

## 2018-08-07 DIAGNOSIS — Z8551 Personal history of malignant neoplasm of bladder: Secondary | ICD-10-CM | POA: Diagnosis not present

## 2018-08-07 DIAGNOSIS — Z87440 Personal history of urinary (tract) infections: Secondary | ICD-10-CM | POA: Diagnosis not present

## 2018-08-07 DIAGNOSIS — N323 Diverticulum of bladder: Secondary | ICD-10-CM | POA: Diagnosis not present

## 2018-08-07 DIAGNOSIS — N210 Calculus in bladder: Secondary | ICD-10-CM | POA: Diagnosis not present

## 2018-08-18 ENCOUNTER — Other Ambulatory Visit: Payer: Self-pay | Admitting: Family Medicine

## 2018-08-20 NOTE — Telephone Encounter (Signed)
Pt needs f/u for refills. Have called and l/m to call office.

## 2018-08-22 ENCOUNTER — Other Ambulatory Visit: Payer: Self-pay

## 2018-09-04 ENCOUNTER — Ambulatory Visit (INDEPENDENT_AMBULATORY_CARE_PROVIDER_SITE_OTHER): Payer: Medicare Other | Admitting: Family Medicine

## 2018-09-04 ENCOUNTER — Encounter: Payer: Self-pay | Admitting: Family Medicine

## 2018-09-04 ENCOUNTER — Other Ambulatory Visit: Payer: Self-pay

## 2018-09-04 VITALS — BP 140/88 | HR 91 | Temp 98.0°F | Ht 67.0 in | Wt 204.5 lb

## 2018-09-04 DIAGNOSIS — E669 Obesity, unspecified: Secondary | ICD-10-CM | POA: Diagnosis not present

## 2018-09-04 DIAGNOSIS — E7800 Pure hypercholesterolemia, unspecified: Secondary | ICD-10-CM | POA: Diagnosis not present

## 2018-09-04 DIAGNOSIS — I10 Essential (primary) hypertension: Secondary | ICD-10-CM

## 2018-09-04 DIAGNOSIS — Z0000 Encounter for general adult medical examination without abnormal findings: Secondary | ICD-10-CM | POA: Diagnosis not present

## 2018-09-04 DIAGNOSIS — Z8551 Personal history of malignant neoplasm of bladder: Secondary | ICD-10-CM

## 2018-09-04 DIAGNOSIS — Z23 Encounter for immunization: Secondary | ICD-10-CM

## 2018-09-04 DIAGNOSIS — Z87891 Personal history of nicotine dependence: Secondary | ICD-10-CM | POA: Diagnosis not present

## 2018-09-04 LAB — CBC WITH DIFFERENTIAL/PLATELET
Basophils Absolute: 0.1 10*3/uL (ref 0.0–0.1)
Basophils Relative: 1 % (ref 0.0–3.0)
Eosinophils Absolute: 0.2 10*3/uL (ref 0.0–0.7)
Eosinophils Relative: 4.1 % (ref 0.0–5.0)
HCT: 46.1 % (ref 39.0–52.0)
Hemoglobin: 15.7 g/dL (ref 13.0–17.0)
Lymphocytes Relative: 28.4 % (ref 12.0–46.0)
Lymphs Abs: 1.6 10*3/uL (ref 0.7–4.0)
MCHC: 34.1 g/dL (ref 30.0–36.0)
MCV: 95.3 fl (ref 78.0–100.0)
Monocytes Absolute: 0.4 10*3/uL (ref 0.1–1.0)
Monocytes Relative: 6.8 % (ref 3.0–12.0)
Neutro Abs: 3.3 10*3/uL (ref 1.4–7.7)
Neutrophils Relative %: 59.7 % (ref 43.0–77.0)
Platelets: 163 10*3/uL (ref 150.0–400.0)
RBC: 4.84 Mil/uL (ref 4.22–5.81)
RDW: 13.7 % (ref 11.5–15.5)
WBC: 5.5 10*3/uL (ref 4.0–10.5)

## 2018-09-04 LAB — LIPID PANEL
Cholesterol: 141 mg/dL (ref 0–200)
HDL: 40.4 mg/dL (ref >39.00)
LDL Cholesterol: 72 mg/dL (ref 0–99)
NonHDL: 100.75
Total CHOL/HDL Ratio: 3
Triglycerides: 142 mg/dL (ref 0.0–149.0)
VLDL: 28.4 mg/dL (ref 0.0–40.0)

## 2018-09-04 LAB — COMPREHENSIVE METABOLIC PANEL
ALT: 34 U/L (ref 0–53)
AST: 24 U/L (ref 0–37)
Albumin: 4.6 g/dL (ref 3.5–5.2)
Alkaline Phosphatase: 88 U/L (ref 39–117)
BUN: 12 mg/dL (ref 6–23)
CO2: 28 mEq/L (ref 19–32)
Calcium: 9.5 mg/dL (ref 8.4–10.5)
Chloride: 102 mEq/L (ref 96–112)
Creatinine, Ser: 0.97 mg/dL (ref 0.40–1.50)
GFR: 75.75 mL/min (ref >60.00)
Glucose, Bld: 87 mg/dL (ref 70–99)
Potassium: 4.2 mEq/L (ref 3.5–5.1)
Sodium: 139 mEq/L (ref 135–145)
Total Bilirubin: 0.5 mg/dL (ref 0.2–1.2)
Total Protein: 7 g/dL (ref 6.0–8.3)

## 2018-09-04 MED ORDER — SIMVASTATIN 20 MG PO TABS: 20.0000 mg | ORAL_TABLET | Freq: Every evening | ORAL | 2 refills | Status: DC

## 2018-09-04 NOTE — Progress Notes (Signed)
Subjective:    Brent Butler is a 73 y.o. male who presents for Medicare Annual (Subsequent) preventive examination.  Review of Systems  Constitutional: Negative for chills, fever, malaise/fatigue and weight loss.  Respiratory: Negative for cough, shortness of breath and wheezing.   Cardiovascular: Negative for chest pain, palpitations and leg swelling.  Gastrointestinal: Negative for abdominal pain, constipation, diarrhea, nausea and vomiting.  Genitourinary: Negative for dysuria and urgency.  Musculoskeletal: Negative for joint pain and myalgias.  Skin: Negative for rash.  Neurological: Negative for dizziness and headaches.  Psychiatric/Behavioral: Negative for depression, substance abuse and suicidal ideas. The patient is not nervous/anxious.    Objective:   Vitals: BP 140/88 (BP Location: Left Arm, Patient Position: Sitting, Cuff Size: Normal)   Pulse 91   Temp 98 F (36.7 C) (Oral)   Ht 5\' 7"  (1.702 m)   Wt 204 lb 8 oz (92.8 kg)   SpO2 96%   BMI 32.03 kg/m   Body mass index is 32.03 kg/m.  Physical Exam Vitals signs and nursing note reviewed.  Constitutional:      General: He is not in acute distress.    Appearance: He is well-developed.  HENT:     Head: Normocephalic and atraumatic.     Right Ear: External ear normal.     Left Ear: External ear normal.     Nose: Nose normal.  Eyes:     Conjunctiva/sclera: Conjunctivae normal.     Pupils: Pupils are equal, round, and reactive to light.  Neck:     Musculoskeletal: Neck supple.  Cardiovascular:     Rate and Rhythm: Normal rate and regular rhythm.  Pulmonary:     Effort: Pulmonary effort is normal.  Abdominal:     General: Bowel sounds are normal.     Palpations: Abdomen is soft.  Musculoskeletal: Normal range of motion.  Skin:    General: Skin is warm.  Neurological:     Mental Status: He is alert.  Psychiatric:        Behavior: Behavior normal.    Social History   Tobacco Use  Smoking Status  Former Smoker  . Quit date: 09/06/2002  . Years since quitting: 16.0  Smokeless Tobacco Former Systems developer  . Quit date: 11/19/1998     Patient Active Problem List   Diagnosis Date Noted  . ED (erectile dysfunction) 09/07/2016  . Obesity (BMI 30-39.9) 09/07/2016  . Malignant neoplasm of urinary bladder ([place]) 05/27/2015  . Prehypertension 05/27/2015   Past Surgical History:  Procedure Laterality Date  . BLADDER TUMOR EXCISION    . CYSTOSCOPY N/A 09/11/2013   Procedure: CYSTOSCOPY, FLEXIBLE CYSTOSCOPY,  BLADDER BIOPSY WITH FULGERATION ;  Surgeon: Brent So, MD;  Location[facility];  Service: Urology;  Laterality: N/A;  . LITHOTRIPSY     History reviewed. No pertinent family history. Social History   Socioeconomic History  . Marital status: Married    Spouse name: Not on file  . Number of children: Not on file  . Years of education: Not on file  . Highest education level: Not on file  Occupational History  . Occupation: retired    Comment: Naval architect  Social Needs  . Financial resource strain: Not on file  . Food insecurity    Worry: Not on file    Inability: Not on file  . Transportation needs    Medical: Not on file    Non-medical: Not on file  Tobacco Use  . Smoking status: Former  Smoker    Quit date: 09/06/2002    Years since quitting: 16.0  . Smokeless tobacco: Former Systems developer    Quit date: 11/19/1998  Substance and Sexual Activity  . Alcohol use: Yes    Alcohol/week: 1.0 standard drinks    Types: 1 Glasses of wine per week  . Drug use: No  . Sexual activity: Yes  Lifestyle  . Physical activity    Days per week: Not on file    Minutes per session: Not on file  . Stress: Not on file  Relationships  . Social Herbalist on phone: Not on file    Gets together: Not on file    Attends religious service: Not on file    Active member of club or organization: Not on file    Attends meetings of clubs or organizations: Not on file     Relationship status: Not on file  Other Topics Concern  . Not on file  Social History Narrative  . Not on file    Current Outpatient Medications:  .  doxazosin (CARDURA) 8 MG tablet, Take 8 mg by mouth at bedtime., Disp: , Rfl:  .  sildenafil (REVATIO) 20 MG tablet, Take by mouth., Disp: , Rfl:  .  sildenafil (VIAGRA) 100 MG tablet, Take 50 mg by mouth daily as needed. Takes 1/2 tablet as needed for erectile dysfunction, Disp: , Rfl:  .  simvastatin (ZOCOR) 20 MG tablet, Take 1 tablet (20 mg total) by mouth every evening., Disp: 90 tablet, Rfl: 2  Activities of Daily Living In your present state of health, do you have any difficulty performing the following activities: 09/10/2018  Hearing? N  Vision? N  Difficulty concentrating or making decisions? N  Walking or climbing stairs? N  Dressing or bathing? N  Doing errands, shopping? N  Preparing Food and eating ? N  Using the Toilet? N  In the past six months, have you accidently leaked urine? N  Do you have problems with loss of bowel control? N  Managing your Medications? N  Managing your Finances? N  Housekeeping or managing your Housekeeping? N  Some recent data might be hidden   Patient Care Team: Brent Deutscher, DO as PCP - General (Family Medicine) Brent Guys, MD as Consulting Physician (Ophthalmology) Brent Seal, MD as Attending Physician (Urology) Brent Essex, MD as Consulting Physician (Gastroenterology) Assessment:  This is a routine wellness examination for Brent Butler.  Exercise Activities and Dietary recommendations Current Exercise Habits: The patient does not participate in regular exercise at present  Fall Risk Fall Risk  09/04/2018 03/06/2017 11/13/2016  Falls in the past year? 0 Yes -  Number falls in past yr: - 2 or more -  Injury with Fall? - Yes Yes  Risk Factor Category  - High Fall Risk -  Risk for fall due to : - History of fall(s) -  Follow up - Education provided;Falls prevention discussed -    Timed Get Up and Go performed: yes, taking < 12 seconds Note: If takes > 12 seconds to complete, patient at increased risk of falling. Note if patient has slow pace, loss of balance, short strides, little or no arm swings, steadying self on walls, shuffling, not using assist device properly.   Depression Screen PHQ 2/9 Scores 09/04/2018 03/06/2017 11/13/2016  PHQ - 2 Score 0 0 0  PHQ- 9 Score 0 0 -    Cognitive Function MMSE - Mini Mental State Exam 09/10/2018  Orientation to time 5  Orientation to time comments 5  Orientation to Place 5  Registration 3  Attention/ Calculation 5  Recall 3  Language- name 2 objects 2  Language- repeat 1  Language- follow 3 step command 3  Language- read & follow direction 1  Write a sentence 1  Copy design 1  Total score 30   Immunization History  Administered Date(s) Administered  . Influenza, High Dose Seasonal PF 12/06/2015  . Influenza-Unspecified 12/18/2016, 12/24/2017  . Pneumococcal Conjugate-13 01/12/2017  . Pneumococcal Polysaccharide-23 09/04/2018  . Tdap 12/06/2015   Qualifies for Shingles Vaccine? Yes Note: Contraindications include severe allergic reaction (e.g., anaphylaxis) after a previous dose or to a vaccine component, known severe immunodeficiency (e.g., from hematologic and solid tumors, receipt of chemotherapy, congenital immunodeficiency,  long-term immunosuppressive therapy(g) or patients with HIV infection who are severely immunocompromised), and pregnancy.  Screening Tests Health Maintenance  Topic Date Due  . INFLUENZA VACCINE  10/19/2018  . TETANUS/TDAP  12/05/2025  . COLONOSCOPY  01/03/2026  . Hepatitis C Screening  Completed  . PNA vac Low Risk Adult  Completed   Plan:   PLEASE NOTE THAT ALL TESTS AND QUESTIONS WERE COMPLETED ON DAY OF VISIT. THIS MAY NOT REFLECT DATE OF SERVICE IF INFORMATION ENTERED ON SUBSEQUENT DAY. I have personally reviewed and noted the following in the patient's chart:   . Medical  and social history . Use of alcohol, tobacco or illicit drugs  . Current medications and supplements . Functional ability and status . Nutritional status . Physical activity . Advanced directives . List of other physicians . Hospitalizations, surgeries, and ER visits in previous 12 months . Vitals . Screenings to include cognitive, depression, and falls . Referrals and appointments  In addition, I have reviewed and discussed with patient certain preventive protocols, quality metrics, and best practice recommendations. A written personalized care plan for preventive services as well as general preventive health recommendations were provided to patient. [facility] QUESTIONS ANSWERED AND SCANNED INTO CHART UNDER MEDIA SECTION.  Brent Deutscher, DO  09/10/2018

## 2018-09-04 NOTE — Progress Notes (Deleted)
Subjective:    Brent Butler is a 73 y.o. male who presents for Medicare Annual (Subsequent) preventive examination.  ROS  Objective:   Vitals: Pulse 91   Temp 98 F (36.7 C) (Oral)   Ht 5\' 7"  (1.702 m)   Wt 204 lb 8 oz (92.8 kg)   SpO2 96%   BMI 32.03 kg/m   Body mass index is 32.03 kg/m.  Physical Exam  Social History   Tobacco Use  Smoking Status Former Smoker  . Quit date: 09/06/2002  . Years since quitting: 16.0  Smokeless Tobacco Former Systems developer  . Quit date: 11/19/1998     Patient Active Problem List   Diagnosis Date Noted  . ED (erectile dysfunction) 09/07/2016  . Obesity (BMI 30-39.9) 09/07/2016  . Malignant neoplasm of urinary bladder ([place]) 05/27/2015  . Prehypertension 05/27/2015   Past Surgical History:  Procedure Laterality Date  . BLADDER TUMOR EXCISION    . CYSTOSCOPY N/A 09/11/2013   Procedure: CYSTOSCOPY, FLEXIBLE CYSTOSCOPY,  BLADDER BIOPSY WITH FULGERATION ;  Surgeon: Malka So, MD;  Location: [facility];  Service: Urology;  Laterality: N/A;  . LITHOTRIPSY     History reviewed. No pertinent family history. Social History   Socioeconomic History  . Marital status: Married    Spouse name: Not on file  . Number of children: Not on file  . Years of education: Not on file  . Highest education level: Not on file  Occupational History  . Occupation: retired    Comment: Naval architect  Social Needs  . Financial resource strain: Not on file  . Food insecurity    Worry: Not on file    Inability: Not on file  . Transportation needs    Medical: Not on file    Non-medical: Not on file  Tobacco Use  . Smoking status: Former Smoker    Quit date: 09/06/2002    Years since quitting: 16.0  . Smokeless tobacco: Former Systems developer    Quit date: 11/19/1998  Substance and Sexual Activity  . Alcohol use: Yes    Alcohol/week: 1.0 standard drinks    Types: 1 Glasses of wine per week  . Drug use: No  . Sexual activity: Yes  Lifestyle   . Physical activity    Days per week: Not on file    Minutes per session: Not on file  . Stress: Not on file  Relationships  . Social Herbalist on phone: Not on file    Gets together: Not on file    Attends religious service: Not on file    Active member of club or organization: Not on file    Attends meetings of clubs or organizations: Not on file    Relationship status: Not on file  Other Topics Concern  . Not on file  Social History Narrative  . Not on file     Current Outpatient Medications:  .  doxazosin (CARDURA) 8 MG tablet, Take 8 mg by mouth at bedtime., Disp: , Rfl:  .  sildenafil (REVATIO) 20 MG tablet, Take by mouth., Disp: , Rfl:  .  sildenafil (VIAGRA) 100 MG tablet, Take 50 mg by mouth daily as needed. Takes 1/2 tablet as needed for erectile dysfunction, Disp: , Rfl:  .  simvastatin (ZOCOR) 20 MG tablet, TAKE 1 TABLET BY MOUTH  EVERY EVENING, Disp: 90 tablet, Rfl: 0  Activities of Daily Living No flowsheet data found.  Patient Care Team: Briscoe Deutscher, DO as  PCP - General (Family Medicine) Rutherford Guys, MD as Consulting Physician (Ophthalmology) [name], MD as Attending Physician (Urology) Clarene Essex, MD as Consulting Physician (Gastroenterology) Assessment:  This is a routine wellness examination for Brent Butler.  Exercise Activities and Dietary recommendations    Fall Risk Fall Risk  09/04/2018 03/06/2017 11/13/2016  Falls in the past year? 0 Yes -  Number falls in past yr: - 2 or more -  Injury with Fall? - Yes Yes  Risk Factor Category  - High Fall Risk -  Risk for fall due to : - History of fall(s) -  Follow up - Education provided;Falls prevention discussed -   Is the patient's home free of loose throw rugs in walkways, pet beds, electrical cords, etc?   {Blank single:19197::"yes","no"}. Grab bars in the bathroom? {Blank single:19197::"yes","no"}. Handrails on the stairs?   {Blank single:19197::"yes","no"}. Adequate lighting? {Blank  single:19197::"yes","no"}.  Timed Get Up and Go performed: yes, taking < 12 seconds Note: If takes > 12 seconds to complete, patient at increased risk of falling. Note if patient has slow pace, loss of balance, short strides, little or no arm swings, steadying self on walls, shuffling, not using assist device properly.   Depression Screen PHQ 2/9 Scores 09/04/2018 03/06/2017 11/13/2016  PHQ - 2 Score 0 0 0  PHQ- 9 Score 0 0 -     Cognitive Function        Immunization History  Administered Date(s) Administered  . Influenza, High Dose Seasonal PF 12/06/2015  . Influenza-Unspecified 12/18/2016, 12/24/2017  . Pneumococcal Conjugate-13 01/12/2017  . Tdap 12/06/2015    Qualifies for Shingles Vaccine? Yes Note: Contraindications include severe allergic reaction (e.g., anaphylaxis) after a previous dose or to a vaccine component, known severe immunodeficiency (e.g., from hematologic and solid tumors, receipt of chemotherapy, congenital immunodeficiency,  long-term immunosuppressive therapy(g) or patients with HIV infection who are severely immunocompromised), and pregnancy.  Screening Tests Health Maintenance  Topic Date Due  . PNA vac Low Risk Adult (2 of 2 - PPSV23) 01/12/2018  . INFLUENZA VACCINE  10/19/2018  . TETANUS/TDAP  12/05/2025  . COLONOSCOPY  01/03/2026  . Hepatitis C Screening  Completed    Cancer Screenings: Lung: Low Dose CT Chest recommended if Age 71-80 years, 30 pack-year currently smoking OR have quit w/in 15years. Patient {DOES NOT does:27190::"does not"} qualify. Breast:  Up to date on Mammogram? {Yes/No:30480221}   Up to date of Bone Density/Dexa? {Yes/No:30480221} Colorectal: ***  Additional Screenings:  Hepatitis C Screening:   Plan:   I have personally reviewed and noted the following in the patient's chart:   . Medical and social history . Use of alcohol, tobacco or illicit drugs  . Current medications and supplements . Functional ability and  status . Nutritional status . Physical activity . Advanced directives . List of other physicians . Hospitalizations, surgeries, and ER visits in previous 12 months . Vitals . Screenings to include cognitive, depression, and falls . Referrals and appointments  In addition, I have reviewed and discussed with patient certain preventive protocols, quality metrics, and best practice recommendations. A written personalized care plan for preventive services as well as general preventive health recommendations were provided to patient. [facility] QUESTIONS ANSWERED AND SCANNED INTO CHART UNDER MEDIA SECTION.  Briscoe Deutscher, DO  09/04/2018

## 2018-09-10 ENCOUNTER — Encounter: Payer: Self-pay | Admitting: Family Medicine

## 2018-10-07 DIAGNOSIS — Z961 Presence of intraocular lens: Secondary | ICD-10-CM | POA: Diagnosis not present

## 2018-12-19 ENCOUNTER — Ambulatory Visit (INDEPENDENT_AMBULATORY_CARE_PROVIDER_SITE_OTHER): Payer: Medicare Other | Admitting: *Deleted

## 2018-12-19 ENCOUNTER — Other Ambulatory Visit: Payer: Self-pay

## 2018-12-19 DIAGNOSIS — Z23 Encounter for immunization: Secondary | ICD-10-CM | POA: Diagnosis not present

## 2018-12-24 ENCOUNTER — Other Ambulatory Visit: Payer: Self-pay

## 2018-12-24 ENCOUNTER — Encounter: Payer: Self-pay | Admitting: Family Medicine

## 2018-12-24 ENCOUNTER — Ambulatory Visit (INDEPENDENT_AMBULATORY_CARE_PROVIDER_SITE_OTHER): Payer: Medicare Other | Admitting: Family Medicine

## 2018-12-24 VITALS — BP 142/86 | HR 88 | Temp 97.6°F | Ht 67.0 in | Wt 207.4 lb

## 2018-12-24 DIAGNOSIS — C679 Malignant neoplasm of bladder, unspecified: Secondary | ICD-10-CM

## 2018-12-24 DIAGNOSIS — R200 Anesthesia of skin: Secondary | ICD-10-CM | POA: Diagnosis not present

## 2018-12-24 DIAGNOSIS — E785 Hyperlipidemia, unspecified: Secondary | ICD-10-CM

## 2018-12-24 DIAGNOSIS — M67911 Unspecified disorder of synovium and tendon, right shoulder: Secondary | ICD-10-CM

## 2018-12-24 DIAGNOSIS — N529 Male erectile dysfunction, unspecified: Secondary | ICD-10-CM | POA: Diagnosis not present

## 2018-12-24 MED ORDER — DICLOFENAC SODIUM 75 MG PO TBEC: 75.0000 mg | DELAYED_RELEASE_TABLET | Freq: Two times a day (BID) | ORAL | 0 refills | Status: DC

## 2018-12-24 NOTE — Assessment & Plan Note (Signed)
Stable.  Continue management per urology. 

## 2018-12-24 NOTE — Assessment & Plan Note (Signed)
Continue Viagra 50 mg daily as needed.

## 2018-12-24 NOTE — Progress Notes (Signed)
   Chief Complaint:  Brent Butler is a 73 y.o. male who presents for same day appointment with a chief complaint of shoulder Butler.   Assessment/Plan:  Shoulder Butler Consistent with rotator cuff tendinopathy.  Will start diclofenac 75 mg twice daily for the next 1 to 2 weeks.  Discussed home exercises and handout was given.  Discussed reasons to return to care.  Follow-up as needed.  Finger Numbness Consistent with carpal tunnel.  Should have improvement with diclofenac as above.  Also gave handout.  Recommended wearing wrist splint at night.  Dyslipidemia Stable.  Continue simvastatin 20 mg daily.  Check lipid panel with next blood draw during CPE.  ED (erectile dysfunction) Continue Viagra 50 mg daily as needed.  Bladder cancer ([place]) s/p excision 2015 Stable.  Continue management per urology.     Subjective:  HPI:  He has been having some right shoulder Butler for the last 2 months.  No obvious injuries, falls, or other precipitating events.  No treatments tried.  Worse with certain motions including those above his head and behind his back.  He is also having some tingling and numbness in his fingers.  This mostly occurs at night.  Mostly located in his thumb and index finger however can involve all his fingers.  No specific treatments tried for this.  No weakness. No other obvious alleviating or aggravating factors.   His stable, chronic medical conditions are outlined below:  # ED / BPH/ History of Bladder Cancer - Follows with Urology - Dr Jeffie Pollock - On Cardura 8 mg daily and Viagra 50 mg daily as needed  #Dyslipidemia - On simvastatin 20 mg daily.  Tolerating well. - ROS: Reported myalgias.  ROS: Per HPI  PMH: He reports that he quit smoking about 16 years ago. He quit smokeless tobacco use about 20 years ago. He reports current alcohol use of about 1.0 standard drinks of alcohol per week. He reports that he does not use drugs.      Objective:  Physical Exam: BP (!)  142/86   Pulse 88   Temp 97.6 F (36.4 C)   Ht 5\' 7"  (1.702 m)   Wt 207 lb 6.1 oz (94.1 kg)   SpO2 95%   BMI 32.48 kg/m   Gen: NAD, resting comfortably MSK: -Neck: No deformities.  Full range motion.  Spurling negative on right and left - Shoulder: Limited range of motion above shoulder due to Butler.  Positive Neer and Hawkins test.  Butler elicited with supraspinatus testing and external rotation. - Hand: No deformities.  Strength 5 out of 5 in all fields.  Tinel sign positive.  Phalen test positive.     Brent Butler. Brent Pain, MD 12/24/2018 8:47 AM

## 2018-12-24 NOTE — Patient Instructions (Addendum)
It was very nice to see you today!  You have inflammation in the rotator cuff.  He also have inflammation in the nerve in your hand.  Please take the anti-inflammatory twice per day for the next 1 to 2 weeks.  Please work on exercises for your shoulder.  Please look into getting a wrist splint to use at night for your nose.  Please let me know if your symptoms worsen or do not improve the next few weeks.  Come back to see me in June for your next physical, or sooner if needed.  Take care, Dr Jerline Pain  Please try these tips to maintain a healthy lifestyle:   Eat at least 3 REAL meals and 1-2 snacks per day.  Aim for no more than 5 hours between eating.  If you eat breakfast, please do so within one hour of getting up.    Obtain twice as many fruits/vegetables as protein or carbohydrate foods for both lunch and dinner. (Half of each meal should be fruits/vegetables, one quarter protein, and one quarter starchy carbs)   Cut down on sweet beverages. This includes juice, soda, and sweet tea.    Exercise at least 150 minutes every week.

## 2018-12-24 NOTE — Assessment & Plan Note (Signed)
Stable.  Continue simvastatin 20 mg daily.  Check lipid panel with next blood draw during CPE.

## 2019-05-01 ENCOUNTER — Ambulatory Visit: Payer: Medicare Other

## 2019-06-16 ENCOUNTER — Telehealth: Payer: Self-pay | Admitting: Family Medicine

## 2019-06-16 ENCOUNTER — Other Ambulatory Visit: Payer: Self-pay | Admitting: *Deleted

## 2019-06-16 DIAGNOSIS — E7800 Pure hypercholesterolemia, unspecified: Secondary | ICD-10-CM

## 2019-06-16 MED ORDER — SIMVASTATIN 20 MG PO TABS: 20.0000 mg | ORAL_TABLET | Freq: Every evening | ORAL | 0 refills | Status: DC

## 2019-06-16 NOTE — Telephone Encounter (Signed)
Pt advised need OV for future refills

## 2019-06-16 NOTE — Telephone Encounter (Signed)
MEDICATION: simvastatin 20 MG  PHARMACY: [facility] [id number]  Comments:   **Let patient know to contact pharmacy at the end of the day to make sure medication is ready. **  ** Please notify patient to allow 48-72 hours to process**  **Encourage patient to contact the pharmacy for refills or they can request refills through [facility]**

## 2019-06-23 ENCOUNTER — Encounter: Payer: Self-pay | Admitting: Family Medicine

## 2019-06-23 ENCOUNTER — Ambulatory Visit (INDEPENDENT_AMBULATORY_CARE_PROVIDER_SITE_OTHER): Payer: Medicare Other | Admitting: Family Medicine

## 2019-06-23 ENCOUNTER — Other Ambulatory Visit: Payer: Self-pay

## 2019-06-23 VITALS — BP 140/98 | HR 75 | Temp 98.0°F | Ht 67.0 in | Wt 209.0 lb

## 2019-06-23 DIAGNOSIS — E7800 Pure hypercholesterolemia, unspecified: Secondary | ICD-10-CM

## 2019-06-23 DIAGNOSIS — R030 Elevated blood-pressure reading, without diagnosis of hypertension: Secondary | ICD-10-CM

## 2019-06-23 DIAGNOSIS — E785 Hyperlipidemia, unspecified: Secondary | ICD-10-CM

## 2019-06-23 MED ORDER — SIMVASTATIN 20 MG PO TABS: 20.0000 mg | ORAL_TABLET | Freq: Every evening | ORAL | 0 refills | Status: DC

## 2019-06-23 NOTE — Progress Notes (Signed)
   Brent Butler is a 74 y.o. male who presents today for an office visit.  Assessment/Plan:  Chronic Problems Addressed Today: Dyslipidemia Stable.  Continue simvastatin 20 mg daily.  Will come back in 3 to 6 months for next visit and we will recheck lipid panel at that time.  Prehypertension Diastolic elevated today though previously at goal per JNC 8.  Continue home monitoring goal 150/90 or lower.    Subjective:  HPI:  See A/p.          Objective:  Physical Exam: BP (!) 140/98 (BP Location: Right Arm, Patient Position: Sitting, Cuff Size: Normal)   Pulse 75   Temp 98 F (36.7 C) (Temporal)   Ht 5\' 7"  (1.702 m)   Wt 209 lb (94.8 kg)   SpO2 96%   BMI 32.73 kg/m   Gen: No acute distress, resting comfortably CV: Regular rate and rhythm with no murmurs appreciated Pulm: Normal work of breathing, clear to auscultation bilaterally with no crackles, wheezes, or rhonchi Neuro: Grossly normal, moves all extremities Psych: Normal affect and thought content      Annabeth Tortora M. Jerline Pain, MD 06/23/2019 9:46 AM

## 2019-06-23 NOTE — Assessment & Plan Note (Signed)
Diastolic elevated today though previously at goal per JNC 8.  Continue home monitoring goal 150/90 or lower.

## 2019-06-23 NOTE — Assessment & Plan Note (Signed)
Stable.  Continue simvastatin 20 mg daily.  Will come back in 3 to 6 months for next visit and we will recheck lipid panel at that time.

## 2019-08-01 DIAGNOSIS — R972 Elevated prostate specific antigen [PSA]: Secondary | ICD-10-CM | POA: Diagnosis not present

## 2019-08-08 DIAGNOSIS — Z8551 Personal history of malignant neoplasm of bladder: Secondary | ICD-10-CM | POA: Diagnosis not present

## 2019-08-08 DIAGNOSIS — R3912 Poor urinary stream: Secondary | ICD-10-CM | POA: Diagnosis not present

## 2019-08-08 DIAGNOSIS — N210 Calculus in bladder: Secondary | ICD-10-CM | POA: Diagnosis not present

## 2019-08-08 DIAGNOSIS — N401 Enlarged prostate with lower urinary tract symptoms: Secondary | ICD-10-CM | POA: Diagnosis not present

## 2019-08-08 DIAGNOSIS — R972 Elevated prostate specific antigen [PSA]: Secondary | ICD-10-CM | POA: Diagnosis not present

## 2019-08-27 ENCOUNTER — Other Ambulatory Visit: Payer: Self-pay

## 2019-08-28 ENCOUNTER — Encounter: Payer: Self-pay | Admitting: Family Medicine

## 2019-08-28 ENCOUNTER — Ambulatory Visit (INDEPENDENT_AMBULATORY_CARE_PROVIDER_SITE_OTHER): Payer: Medicare Other | Admitting: Family Medicine

## 2019-08-28 VITALS — BP 140/88 | HR 91 | Temp 98.3°F | Ht 67.0 in | Wt 208.0 lb

## 2019-08-28 DIAGNOSIS — R030 Elevated blood-pressure reading, without diagnosis of hypertension: Secondary | ICD-10-CM

## 2019-08-28 DIAGNOSIS — R590 Localized enlarged lymph nodes: Secondary | ICD-10-CM

## 2019-08-28 NOTE — Patient Instructions (Addendum)
  There are no preventive care reminders to display for this patient.  Bring your Home cuff to next appointment with Dr. Jerline Pain to make sure that it is accurate. If you start to have elevated readings at home reach out to Dr. Jerline Pain to let him know.    We will call you within two weeks about your referral to ENT. If you do not hear within 3 weeks, give [place] a call.   If it resolves before the appointment with ENT you can cancel. If any new are worsening symptoms please give our office a call.

## 2019-08-28 NOTE — Progress Notes (Signed)
  Phone [phone] In person visit   Subjective:   Brent Butler is a 74 y.o. year old very pleasant male patient who presents for/with See problem oriented charting Chief Complaint  Patient presents with  . Follow-up  . lump on neck   This visit occurred during the SARS-CoV-2 public health emergency.  Safety protocols were in place, including screening questions prior to the visit, additional usage of staff PPE, and extensive cleaning of exam room while observing appropriate contact time as indicated for disinfecting solutions.   Past Medical History-  Patient Active Problem List   Diagnosis Date Noted  . Dyslipidemia 12/24/2018  . ED (erectile dysfunction) 09/07/2016  . Bladder cancer [facility]) s/p excision 2015 05/27/2015  . Prehypertension 05/27/2015    Medications- reviewed and updated Current Outpatient Medications  Medication Sig Dispense Refill  . doxazosin (CARDURA) 8 MG tablet Take 8 mg by mouth at bedtime.    . sildenafil (VIAGRA) 100 MG tablet Take 50 mg by mouth daily as needed. Takes 1/2 tablet as needed for erectile dysfunction    . simvastatin (ZOCOR) 20 MG tablet Take 1 tablet (20 mg total) by mouth every evening. 90 tablet 0   No current facility-administered medications for this visit.     Objective:  BP 140/88   Pulse 91   Temp 98.3 F (36.8 C) (Temporal)   Ht 5\' 7"  (1.702 m)   Wt 208 lb (94.3 kg)   SpO2 94%   BMI 32.58 kg/m  Gen: NAD, resting comfortably Tympanic membrane normal bilaterally, oropharynx normal, tympanic membranes normal Approximately 9 x 7 mm raised area slightly mobile left lower neck CV: RRR no murmurs rubs or gallops Lungs: CTAB no crackles, wheeze, rhonchi Ext: no edema Skin: warm, dry with no obvious rash     Assessment and Plan  Lump on Neck left side S:Noticed about a month ago. Small knot no change in size. No discomfort with swallowing or moving head. Denies any injury to area, redness or discharge. Denies recent  illness. No bites/rashes.  A/P: Appears to have cervical lymphadenopathy with 1 lesion just under a centimeter per his approximation for at least a month-states a month ago is only when he noticed it and may have been present longer.  Since this has been present for at least a month without improvement we will do a referral to ENT for their opinion if biopsy would be recommended.  - he feels well overall- held off on CBC today.   #elevated blood pressure reading.  S: medication: none Home readings #s:  130/80 at home this morning BP Readings from Last 3 Encounters:  08/28/19 140/88  06/23/19 (!) 140/98  12/24/18 (!) 142/86  A/P: Suspect whitecoat hypertension element on top of prehypertension.  Encouraged him to bring home cuff to next visit  Recommended follow up: Return for as needed for new, worsening, persistent symptoms.  Lab/Order associations:   ICD-10-CM   1. Cervical lymphadenopathy  R59.0 Ambulatory referral to ENT  2. Prehypertension  R03.0    Return precautions advised.  Garret Reddish, MD

## 2019-09-07 ENCOUNTER — Other Ambulatory Visit: Payer: Self-pay | Admitting: Family Medicine

## 2019-09-07 DIAGNOSIS — E7800 Pure hypercholesterolemia, unspecified: Secondary | ICD-10-CM

## 2019-09-15 ENCOUNTER — Ambulatory Visit (INDEPENDENT_AMBULATORY_CARE_PROVIDER_SITE_OTHER): Payer: Medicare Other

## 2019-09-15 ENCOUNTER — Other Ambulatory Visit: Payer: Self-pay

## 2019-09-15 VITALS — BP 130/80 | Ht 67.0 in | Wt 205.0 lb

## 2019-09-15 DIAGNOSIS — Z0000 Encounter for general adult medical examination without abnormal findings: Secondary | ICD-10-CM

## 2019-09-15 NOTE — Progress Notes (Signed)
Subjective:   Brent Butler is a 74 y.o. male who presents for an Initial Medicare Annual Wellness Visit.  Virtual Visit via Telephone Note  I connected with  Brent Butler on 09/15/19 at  9:00 AM EDT by telephone and verified that I am speaking with the correct person using two identifiers.  Medicare Annual Wellness visit completed telephonically due to Covid-19 pandemic.   Location: Patient: Home Provider: Office   I discussed the limitations, risks, security and privacy concerns of performing an evaluation and management service by telephone and the availability of in person appointments. The patient expressed understanding and agreed to proceed.  Unable to perform video visit due to video visit attempted and failed and/or patient does not have video capability.   Some vital signs may be absent or patient reported.   Brent Brace, LPN          Review of Systems     Cardiac Risk Factors include: male gender;dyslipidemia;obesity (BMI >30kg/m2)     Objective:    Today's Vitals   09/15/19 0828  BP: 130/80  Weight: 205 lb (93 kg)  Height: 5\' 7"  (1.702 m)   Body mass index is 32.11 kg/m.  Advanced Directives 09/15/2019 09/10/2018 03/06/2017 09/12/2016 09/11/2013 11/09/2011  Does Patient Have a Medical Advance Directive? Yes No No No Patient does not have advance directive;Patient would not like information Patient does not have advance directive  Type of Advance Directive [name];Living will - - - - -  Copy of [place] in Chart? Yes - validated most recent copy scanned in chart (See row information) - - - - -  Would patient like information on creating a medical advance directive? - - Yes (MAU/Ambulatory/Procedural Areas - Information given) No - Patient declined - -    Current Medications (verified) Outpatient Encounter Medications as of 09/15/2019  Medication Sig  . doxazosin (CARDURA) 8 MG tablet Take 8 mg by mouth  at bedtime.  . sildenafil (VIAGRA) 100 MG tablet Take 50 mg by mouth daily as needed. Takes 1/2 tablet as needed for erectile dysfunction  . simvastatin (ZOCOR) 20 MG tablet TAKE 1 TABLET BY MOUTH IN  THE EVENING   No facility-administered encounter medications on file as of 09/15/2019.    Allergies (verified) Patient has no known allergies.   History: Past Medical History:  Diagnosis Date  . Chronic kidney disease   . Ulcer of gastric fundus    Past Surgical History:  Procedure Laterality Date  . BLADDER TUMOR EXCISION    . CYSTOSCOPY N/A 09/11/2013   Procedure: CYSTOSCOPY, FLEXIBLE CYSTOSCOPY,  BLADDER BIOPSY WITH FULGERATION ;  Surgeon: Malka So, MD;  Location: [facility];  Service: Urology;  Laterality: N/A;  . LITHOTRIPSY     History reviewed. No pertinent family history. Social History   Socioeconomic History  . Marital status: Married    Spouse name: Not on file  . Number of children: Not on file  . Years of education: Not on file  . Highest education level: Not on file  Occupational History  . Occupation: retired    Comment: Naval architect  Tobacco Use  . Smoking status: Former Smoker    Quit date: 09/06/2002    Years since quitting: 17.0  . Smokeless tobacco: Former Systems developer    Quit date: 11/19/1998  Vaping Use  . Vaping Use: Never used  Substance and Sexual Activity  . Alcohol use: Yes    Alcohol/week:  1.0 standard drink    Types: 1 Glasses of wine per week  . Drug use: No  . Sexual activity: Yes  Other Topics Concern  . Not on file  Social History Narrative  . Not on file   Social Determinants of Health   Financial Resource Strain: Low Risk   . Difficulty of Paying Living Expenses: Not hard at all  Food Insecurity: No Food Insecurity  . Worried About Charity fundraiser in the Last Year: Never true  . Ran Out of Food in the Last Year: Never true  Transportation Needs: No Transportation Needs  . Lack of Transportation  (Medical): No  . Lack of Transportation (Non-Medical): No  Physical Activity: Sufficiently Active  . Days of Exercise per Week: 5 days  . Minutes of Exercise per Session: 40 min  Stress: No Stress Concern Present  . Feeling of Stress : Not at all  Social Connections: Moderately Isolated  . Frequency of Communication with Friends and Family: Once a week  . Frequency of Social Gatherings with Friends and Family: More than three times a week  . Attends Religious Services: Never  . Active Member of Clubs or Organizations: No  . Attends Archivist Meetings: Never  . Marital Status: Married    Tobacco Counseling Counseling given: Not Answered   Clinical Intake:  Pre-visit preparation completed: Yes  Pain : No/denies pain     BMI - recorded: 32.11 Nutritional Status: BMI > 30  Obese Nutritional Risks: None Diabetes: No  How often do you need to have someone help you when you read instructions, pamphlets, or other written materials from your doctor or pharmacy?: 1 - Never    Interpreter Needed?: No  Information entered by :: Charlott Rakes, LPN   Activities of Daily Living In your present state of health, do you have any difficulty performing the following activities: 09/15/2019  Hearing? N  Vision? N  Difficulty concentrating or making decisions? N  Walking or climbing stairs? N  Dressing or bathing? N  Doing errands, shopping? N  Preparing Food and eating ? N  Using the Toilet? N  In the past six months, have you accidently leaked urine? N  Do you have problems with loss of bowel control? N  Managing your Medications? N  Managing your Finances? N  Housekeeping or managing your Housekeeping? N  Some recent data might be hidden    Patient Care Team: Brent Barrack, MD as PCP - General (Family Medicine) Brent Guys, MD as Consulting Physician (Ophthalmology) Brent Seal, MD as Attending Physician (Urology) Brent Essex, MD as Consulting Physician  (Gastroenterology)  Indicate any recent Medical Services you may have received from other than [facility] providers in the past year (date may be approximate).     Assessment:   This is a routine wellness examination for Brent Butler.  Hearing/Vision screen  Hearing Screening   125Hz  250Hz  500Hz  1000Hz  2000Hz  3000Hz  4000Hz  6000Hz  8000Hz   Right ear:           Left ear:           Comments: Pt denies any difficulty hearing  Vision Screening Comments: Wears eyegalsses and has an annual appt next coming weeks  Dietary issues and exercise activities discussed: Current Exercise Habits: Home exercise routine, Type of exercise: walking, Time (Minutes): 40, Frequency (Times/Week): 5, Weekly Exercise (Minutes/Week): 200, Intensity: Moderate, Exercise limited by: None identified  Goals    . DIET - EAT MORE FRUITS AND VEGETABLES    .  DIET - INCREASE WATER INTAKE    . Increase physical activity    . Lose 10 lbs.     Increase exercise and cut back on sweets.     . Patient Stated     Pt will continue exercise routine to lose weight.      Depression Screen PHQ 2/9 Scores 09/15/2019 12/24/2018 09/04/2018 03/06/2017 11/13/2016  PHQ - 2 Score 0 0 0 0 0  PHQ- 9 Score - - 0 0 -    Fall Risk Fall Risk  09/15/2019 08/28/2019 09/04/2018 03/06/2017 11/13/2016  Falls in the past year? 0 0 0 Yes -  Number falls in past yr: 0 0 - 2 or more -  Injury with Fall? 0 0 - Yes Yes  Risk Factor Category  - - - High Fall Risk -  Risk for fall due to : No Fall Risks - - History of fall(s) -  Follow up Falls prevention discussed - - Education provided;Falls prevention discussed -    Any stairs in or around the home? Yes  If so, are there any without handrails? Yes  Home free of loose throw rugs in walkways, pet beds, electrical cords, etc? Yes  Adequate lighting in your home to reduce risk of falls? Yes   ASSISTIVE DEVICES UTILIZED TO PREVENT FALLS:  Life alert? No  Use of a cane, walker or w/c? No  Grab bars in the  bathroom? Yes  Shower chair or bench in shower? Yes  Elevated toilet seat or a handicapped toilet? Yes   TIMED UP AND GO:  Was the test performed? No .     Cognitive Function: MMSE - Mini Mental State Exam 09/10/2018  Orientation to time 5  Orientation to time comments 5  Orientation to Place 5  Registration 3  Attention/ Calculation 5  Recall 3  Language- name 2 objects 2  Language- repeat 1  Language- follow 3 step command 3  Language- read & follow direction 1  Write a sentence 1  Copy design 1  Total score 30     6CIT Screen 09/15/2019  What Year? 0 points  What month? 0 points  What time? 0 points  Count back from 20 0 points  Months in reverse 0 points  Repeat phrase 0 points  Total Score 0    Immunizations Immunization History  Administered Date(s) Administered  . Fluad Quad(high Dose 65+) 12/19/2018  . Influenza, High Dose Seasonal PF 12/06/2015, 12/24/2017  . Influenza-Unspecified 12/18/2016, 12/24/2017  . PFIZER SARS-COV-2 Vaccination 04/25/2019, 05/16/2019  . Pneumococcal Conjugate-13 01/12/2017  . Pneumococcal Polysaccharide-23 09/04/2018  . Tdap 12/06/2015    TDAP status: Up to date Flu Vaccine status: Up to date Pneumococcal vaccine status: Up to date Covid-19 vaccine status: Completed vaccines  Qualifies for Shingles Vaccine? Yes   Zostavax completed No   Shingrix Completed?: No.    Education has been provided regarding the importance of this vaccine. Patient has been advised to call insurance company to determine out of pocket expense if they have not yet received this vaccine. Advised may also receive vaccine at local pharmacy or Health Dept. Verbalized acceptance and understanding.  Screening Tests Health Maintenance  Topic Date Due  . INFLUENZA VACCINE  10/19/2019  . TETANUS/TDAP  12/05/2025  . COLONOSCOPY  01/03/2026  . COVID-19 Vaccine  Completed  . Hepatitis C Screening  Completed  . PNA vac Low Risk Adult  Completed    Health  Maintenance  There are no preventive care reminders to  display for this patient.  Colorectal cancer screening: Completed 01/04/16. Repeat every 10 years  Additional Screening:  Hepatitis C Screening: Completed 04/05/17  Vision Screening: Recommended annual ophthalmology exams for early detection of glaucoma and other disorders of the eye. Is the patient up to date with their annual eye exam?  Yes  Who is the provider or what is the name of the office in which the patient attends annual eye exams? Dr Gershon Crane  Dental Screening: Recommended annual dental exams for proper oral hygiene  Community Resource Referral / Chronic Care Management: CRR required this visit?  No   CCM required this visit?  No      Plan:     I have personally reviewed and noted the following in the patient's chart:   . Medical and social history . Use of alcohol, tobacco or illicit drugs  . Current medications and supplements . Functional ability and status . Nutritional status . Physical activity . Advanced directives . List of other physicians . Hospitalizations, surgeries, and ER visits in previous 12 months . Vitals . Screenings to include cognitive, depression, and falls . Referrals and appointments  In addition, I have reviewed and discussed with patient certain preventive protocols, quality metrics, and best practice recommendations. A written personalized care plan for preventive services as well as general preventive health recommendations were provided to patient.     Brent Brace, LPN   [id number]/[id number]   Nurse Notes: None

## 2019-09-15 NOTE — Patient Instructions (Addendum)
Brent Butler , Thank you for taking time to come for your Medicare Wellness Visit. I appreciate your ongoing commitment to your health goals. Please review the following plan we discussed and let me know if I can assist you in the future.   Screening recommendations/referrals: Colonoscopy: Done 01/04/16 Recommended yearly ophthalmology/optometry visit for glaucoma screening and checkup Recommended yearly dental visit for hygiene and checkup  Vaccinations: Influenza vaccine: Up to date  Pneumococcal vaccine: Done 09/04/18 Tdap vaccine: Done 12/06/15 Shingles vaccine: Shingrix discussed. Please contact your pharmacy for coverage information.   Covid-19: Completed 2/5/ & 05/16/19  Advanced directives: Copy in chart  Conditions/risks identified: To lose weight and continue exercising  Next appointment: Follow up in one year for your annual wellness visit.   Preventive Care 3 Years and Older, Male Preventive care refers to lifestyle choices and visits with your health care provider that can promote health and wellness. What does preventive care include?  A yearly physical exam. This is also called an annual well check.  Dental exams once or twice a year.  Routine eye exams. Ask your health care provider how often you should have your eyes checked.  Personal lifestyle choices, including:  Daily care of your teeth and gums.  Regular physical activity.  Eating a healthy diet.  Avoiding tobacco and drug use.  Limiting alcohol use.  Practicing safe sex.  Taking low doses of aspirin every day.  Taking vitamin and mineral supplements as recommended by your health care provider. What happens during an annual well check? The services and screenings done by your health care provider during your annual well check will depend on your age, overall health, lifestyle risk factors, and family history of disease. Counseling  Your health care provider may ask you questions about  your:  Alcohol use.  Tobacco use.  Drug use.  Emotional well-being.  Home and relationship well-being.  Sexual activity.  Eating habits.  History of falls.  Memory and ability to understand (cognition).  Work and work Statistician. Screening  You may have the following tests or measurements:  Height, weight, and BMI.  Blood pressure.  Lipid and cholesterol levels. These may be checked every 5 years, or more frequently if you are over 81 years old.  Skin check.  Lung cancer screening. You may have this screening every year starting at age 69 if you have a 30-pack-year history of smoking and currently smoke or have quit within the past 15 years.  Fecal occult blood test (FOBT) of the stool. You may have this test every year starting at age 60.  Flexible sigmoidoscopy or colonoscopy. You may have a sigmoidoscopy every 5 years or a colonoscopy every 10 years starting at age 73.  Prostate cancer screening. Recommendations will vary depending on your family history and other risks.  Hepatitis C blood test.  Hepatitis B blood test.  Sexually transmitted disease (STD) testing.  Diabetes screening. This is done by checking your blood sugar (glucose) after you have not eaten for a while (fasting). You may have this done every 1-3 years.  Abdominal aortic aneurysm (AAA) screening. You may need this if you are a current or former smoker.  Osteoporosis. You may be screened starting at age 50 if you are at high risk. Talk with your health care provider about your test results, treatment options, and if necessary, the need for more tests. Vaccines  Your health care provider may recommend certain vaccines, such as:  Influenza vaccine. This is recommended every year.  Tetanus, diphtheria, and acellular pertussis (Tdap, Td) vaccine. You may need a Td booster every 10 years.  Zoster vaccine. You may need this after age 3.  Pneumococcal 13-valent conjugate (PCV13) vaccine.  One dose is recommended after age 82.  Pneumococcal polysaccharide (PPSV23) vaccine. One dose is recommended after age 60. Talk to your health care provider about which screenings and vaccines you need and how often you need them. This information is not intended to replace advice given to you by your health care provider. Make sure you discuss any questions you have with your health care provider. Document Released: 04/02/2015 Document Revised: 11/24/2015 Document Reviewed: 01/05/2015 Elsevier Interactive Patient Education  2017 [place] Prevention in the Home Falls can cause injuries. They can happen to people of all ages. There are many things you can do to make your home safe and to help prevent falls. What can I do on the outside of my home?  Regularly fix the edges of walkways and driveways and fix any cracks.  Remove anything that might make you trip as you walk through a door, such as a raised step or threshold.  Trim any bushes or trees on the path to your home.  Use bright outdoor lighting.  Clear any walking paths of anything that might make someone trip, such as rocks or tools.  Regularly check to see if handrails are loose or broken. Make sure that both sides of any steps have handrails.  Any raised decks and porches should have guardrails on the edges.  Have any leaves, snow, or ice cleared regularly.  Use sand or salt on walking paths during winter.  Clean up any spills in your garage right away. This includes oil or grease spills. What can I do in the bathroom?  Use night lights.  Install grab bars by the toilet and in the tub and shower. Do not use towel bars as grab bars.  Use non-skid mats or decals in the tub or shower.  If you need to sit down in the shower, use a plastic, non-slip stool.  Keep the floor dry. Clean up any water that spills on the floor as soon as it happens.  Remove soap buildup in the tub or shower regularly.  Attach bath  mats securely with double-sided non-slip rug tape.  Do not have throw rugs and other things on the floor that can make you trip. What can I do in the bedroom?  Use night lights.  Make sure that you have a light by your bed that is easy to reach.  Do not use any sheets or blankets that are too big for your bed. They should not hang down onto the floor.  Have a firm chair that has side arms. You can use this for support while you get dressed.  Do not have throw rugs and other things on the floor that can make you trip. What can I do in the kitchen?  Clean up any spills right away.  Avoid walking on wet floors.  Keep items that you use a lot in easy-to-reach places.  If you need to reach something above you, use a strong step stool that has a grab bar.  Keep electrical cords out of the way.  Do not use floor polish or wax that makes floors slippery. If you must use wax, use non-skid floor wax.  Do not have throw rugs and other things on the floor that can make you trip. What can I do  with my stairs?  Do not leave any items on the stairs.  Make sure that there are handrails on both sides of the stairs and use them. Fix handrails that are broken or loose. Make sure that handrails are as long as the stairways.  Check any carpeting to make sure that it is firmly attached to the stairs. Fix any carpet that is loose or worn.  Avoid having throw rugs at the top or bottom of the stairs. If you do have throw rugs, attach them to the floor with carpet tape.  Make sure that you have a light switch at the top of the stairs and the bottom of the stairs. If you do not have them, ask someone to add them for you. What else can I do to help prevent falls?  Wear shoes that:  Do not have high heels.  Have rubber bottoms.  Are comfortable and fit you well.  Are closed at the toe. Do not wear sandals.  If you use a stepladder:  Make sure that it is fully opened. Do not climb a closed  stepladder.  Make sure that both sides of the stepladder are locked into place.  Ask someone to hold it for you, if possible.  Clearly mark and make sure that you can see:  Any grab bars or handrails.  First and last steps.  Where the edge of each step is.  Use tools that help you move around (mobility aids) if they are needed. These include:  Canes.  Walkers.  Scooters.  Crutches.  Turn on the lights when you go into a dark area. Replace any light bulbs as soon as they burn out.  Set up your furniture so you have a clear path. Avoid moving your furniture around.  If any of your floors are uneven, fix them.  If there are any pets around you, be aware of where they are.  Review your medicines with your doctor. Some medicines can make you feel dizzy. This can increase your chance of falling. Ask your doctor what other things that you can do to help prevent falls. This information is not intended to replace advice given to you by your health care provider. Make sure you discuss any questions you have with your health care provider. Document Released: 12/31/2008 Document Revised: 08/12/2015 Document Reviewed: 04/10/2014 Elsevier Interactive Patient Education  2017 [employer].

## 2019-10-02 DIAGNOSIS — R221 Localized swelling, mass and lump, neck: Secondary | ICD-10-CM | POA: Diagnosis not present

## 2019-10-07 DIAGNOSIS — Z961 Presence of intraocular lens: Secondary | ICD-10-CM | POA: Diagnosis not present

## 2019-10-07 DIAGNOSIS — Z0100 Encounter for examination of eyes and vision without abnormal findings: Secondary | ICD-10-CM | POA: Diagnosis not present

## 2019-12-24 DIAGNOSIS — Z23 Encounter for immunization: Secondary | ICD-10-CM | POA: Diagnosis not present

## 2020-06-28 DIAGNOSIS — Z23 Encounter for immunization: Secondary | ICD-10-CM | POA: Diagnosis not present

## 2020-07-11 ENCOUNTER — Other Ambulatory Visit: Payer: Self-pay | Admitting: Family Medicine

## 2020-07-11 DIAGNOSIS — E7800 Pure hypercholesterolemia, unspecified: Secondary | ICD-10-CM

## 2020-07-31 ENCOUNTER — Other Ambulatory Visit: Payer: Self-pay | Admitting: Urology

## 2020-08-02 DIAGNOSIS — R972 Elevated prostate specific antigen [PSA]: Secondary | ICD-10-CM | POA: Diagnosis not present

## 2020-08-09 DIAGNOSIS — Z8551 Personal history of malignant neoplasm of bladder: Secondary | ICD-10-CM | POA: Diagnosis not present

## 2020-08-09 DIAGNOSIS — R3121 Asymptomatic microscopic hematuria: Secondary | ICD-10-CM | POA: Diagnosis not present

## 2020-08-09 DIAGNOSIS — R3912 Poor urinary stream: Secondary | ICD-10-CM | POA: Diagnosis not present

## 2020-08-09 DIAGNOSIS — N210 Calculus in bladder: Secondary | ICD-10-CM | POA: Diagnosis not present

## 2020-08-09 DIAGNOSIS — R972 Elevated prostate specific antigen [PSA]: Secondary | ICD-10-CM | POA: Diagnosis not present

## 2020-08-09 DIAGNOSIS — N401 Enlarged prostate with lower urinary tract symptoms: Secondary | ICD-10-CM | POA: Diagnosis not present

## 2020-08-18 DIAGNOSIS — R109 Unspecified abdominal pain: Secondary | ICD-10-CM | POA: Diagnosis not present

## 2020-08-18 DIAGNOSIS — K5730 Diverticulosis of large intestine without perforation or abscess without bleeding: Secondary | ICD-10-CM | POA: Diagnosis not present

## 2020-08-18 DIAGNOSIS — Z86010 Personal history of colonic polyps: Secondary | ICD-10-CM | POA: Diagnosis not present

## 2020-09-27 ENCOUNTER — Ambulatory Visit (INDEPENDENT_AMBULATORY_CARE_PROVIDER_SITE_OTHER): Payer: Medicare Other

## 2020-09-27 DIAGNOSIS — Z0000 Encounter for general adult medical examination without abnormal findings: Secondary | ICD-10-CM

## 2020-09-27 NOTE — Progress Notes (Addendum)
Virtual Visit via Telephone Note  I connected with  MICHEL HENDON on 09/27/20 at  9:30 AM EDT by telephone and verified that I am speaking with the correct person using two identifiers.  Location: Patient: home Provider: office Persons participating in the virtual visit: patient/Nurse Health Advisor   I discussed the limitations, risks, security and privacy concerns of performing an evaluation and management service by telephone and the availability of in person appointments. The patient expressed understanding and agreed to proceed.  Interactive audio and video telecommunications were attempted between this nurse and patient, however failed, due to patient having technical difficulties OR patient did not have access to video capability.  We continued and completed visit with audio only.  Some vital signs may be absent or patient reported.   Willette Brace, LPN   Subjective:   KALEE MCCLENATHAN is a 75 y.o. male who presents for Medicare Annual/Subsequent preventive examination.  Review of Systems     Cardiac Risk Factors include: advanced age (>93men, >29 women);dyslipidemia;male gender     Objective:    There were no vitals filed for this visit. There is no height or weight on file to calculate BMI.  Advanced Directives 09/27/2020 09/15/2019 09/10/2018 03/06/2017 09/12/2016 09/11/2013 11/09/2011  Does Patient Have a Medical Advance Directive? Yes Yes No No No Patient does not have advance directive;Patient would not like information Patient does not have advance directive  Type of Advance Directive Healthcare Power of [place];Living will - - - - -  Copy of [place] in Chart? Yes - validated most recent copy scanned in chart (See row information) Yes - validated most recent copy scanned in chart (See row information) - - - - -  Would patient like information on creating a medical advance directive? - - - Yes (MAU/Ambulatory/Procedural  Areas - Information given) No - Patient declined - -    Current Medications (verified) Outpatient Encounter Medications as of 09/27/2020  Medication Sig   amoxicillin-clavulanate (AUGMENTIN) 875-125 MG tablet SMARTSIG:1 Tablet(s) By Mouth Every 12 Hours   doxazosin (CARDURA) 8 MG tablet TAKE 1 TABLET BY MOUTH  DAILY   sildenafil (VIAGRA) 100 MG tablet Take 50 mg by mouth daily as needed. Takes 1/2 tablet as needed for erectile dysfunction   simvastatin (ZOCOR) 20 MG tablet TAKE 1 TABLET BY MOUTH IN  THE EVENING   No facility-administered encounter medications on file as of 09/27/2020.    Allergies (verified) Patient has no known allergies.   History: Past Medical History:  Diagnosis Date   Chronic kidney disease    Ulcer of gastric fundus    Past Surgical History:  Procedure Laterality Date   BLADDER TUMOR EXCISION     CYSTOSCOPY N/A 09/11/2013   Procedure: CYSTOSCOPY, FLEXIBLE CYSTOSCOPY,  BLADDER BIOPSY WITH FULGERATION ;  Surgeon: Malka So, MD;  Location: [facility];  Service: Urology;  Laterality: N/A;   LITHOTRIPSY     History reviewed. No pertinent family history. Social History   Socioeconomic History   Marital status: Married    Spouse name: Not on file   Number of children: Not on file   Years of education: Not on file   Highest education level: Not on file  Occupational History   Occupation: retired    Comment: Naval architect  Tobacco Use   Smoking status: Former    Pack years: 0.00    Types: Cigarettes    Quit date: 09/06/2002  Years since quitting: 18.0   Smokeless tobacco: Former    Quit date: 11/19/1998  Vaping Use   Vaping Use: Never used  Substance and Sexual Activity   Alcohol use: Yes    Alcohol/week: 1.0 standard drink    Types: 1 Glasses of wine per week   Drug use: No   Sexual activity: Yes  Other Topics Concern   Not on file  Social History Narrative   Not on file   Social Determinants of Health   Financial  Resource Strain: Low Risk    Difficulty of Paying Living Expenses: Not hard at all  Food Insecurity: No Food Insecurity   Worried About Charity fundraiser in the Last Year: Never true   [place] in the Last Year: Never true  Transportation Needs: No Transportation Needs   Lack of Transportation (Medical): No   Lack of Transportation (Non-Medical): No  Physical Activity: Sufficiently Active   Days of Exercise per Week: 5 days   Minutes of Exercise per Session: 60 min  Stress: No Stress Concern Present   Feeling of Stress : Not at all  Social Connections: Moderately Isolated   Frequency of Communication with Friends and Family: Once a week   Frequency of Social Gatherings with Friends and Family: Twice a week   Attends Religious Services: Never   Marine scientist or Organizations: No   Attends Music therapist: Never   Marital Status: Married    Tobacco Counseling Counseling given: Not Answered   Clinical Intake:  Pre-visit preparation completed: Yes  Pain : No/denies pain     BMI - recorded: 29.62 Nutritional Status: BMI 25 -29 Overweight Nutritional Risks: None Diabetes: No  How often do you need to have someone help you when you read instructions, pamphlets, or other written materials from your doctor or pharmacy?: 1 - Never  Diabetic?No  Interpreter Needed?: No  Information entered by :: Charlott Rakes, LPN   Activities of Daily Living In your present state of health, do you have any difficulty performing the following activities: 09/27/2020  Hearing? Y  Comment mild loss  Vision? N  Difficulty concentrating or making decisions? N  Walking or climbing stairs? N  Dressing or bathing? N  Doing errands, shopping? N  Preparing Food and eating ? N  Using the Toilet? N  In the past six months, have you accidently leaked urine? N  Do you have problems with loss of bowel control? N  Managing your Medications? N  Managing your Finances?  N  Housekeeping or managing your Housekeeping? N  Some recent data might be hidden    Patient Care Team: Vivi Barrack, MD as PCP - General (Family Medicine) Rutherford Guys, MD as Consulting Physician (Ophthalmology) [name], MD as Attending Physician (Urology) Clarene Essex, MD as Consulting Physician (Gastroenterology)  Indicate any recent Medical Services you may have received from other than [facility] providers in the past year (date may be approximate).     Assessment:   This is a routine wellness examination for Muhanad.  Hearing/Vision screen Hearing Screening - Comments:: Pt stated mild loss  Vision Screening - Comments:: Pt follows up annually with Dr Gershon Crane for eye exams   Dietary issues and exercise activities discussed: Current Exercise Habits: Home exercise routine, Type of exercise: walking;Other - see comments (stationary bike), Time (Minutes): 60, Frequency (Times/Week): 5, Weekly Exercise (Minutes/Week): 300   Goals Addressed  This Visit's Progress    Patient Stated       Continue to stay healthy         Depression Screen PHQ 2/9 Scores 09/27/2020 09/15/2019 12/24/2018 09/04/2018 03/06/2017 11/13/2016  PHQ - 2 Score 0 0 0 0 0 0  PHQ- 9 Score - - - 0 0 -    Fall Risk Fall Risk  09/27/2020 09/15/2019 08/28/2019 09/04/2018 03/06/2017  Falls in the past year? 0 0 0 0 Yes  Number falls in past yr: 0 0 0 - 2 or more  Injury with Fall? 0 0 0 - Yes  Risk Factor Category  - - - - High Fall Risk  Risk for fall due to : Impaired vision No Fall Risks - - History of fall(s)  Follow up Falls prevention discussed Falls prevention discussed - - Education provided;Falls prevention discussed    FALL RISK PREVENTION PERTAINING TO THE HOME:  Any stairs in or around the home? Yes  If so, are there any without handrails? No  Home free of loose throw rugs in walkways, pet beds, electrical cords, etc? Yes  Adequate lighting in your home to reduce risk of falls? Yes    ASSISTIVE DEVICES UTILIZED TO PREVENT FALLS:  Life alert? No  Use of a cane, walker or w/c? No  Grab bars in the bathroom? Yes  Shower chair or bench in shower? Yes  Elevated toilet seat or a handicapped toilet? No   TIMED UP AND GO:  Was the test performed? No     Cognitive Function: MMSE - Mini Mental State Exam 09/10/2018  Orientation to time 5  Orientation to time comments 5  Orientation to Place 5  Registration 3  Attention/ Calculation 5  Recall 3  Language- name 2 objects 2  Language- repeat 1  Language- follow 3 step command 3  Language- read & follow direction 1  Write a sentence 1  Copy design 1  Total score 30     6CIT Screen 09/27/2020 09/15/2019  What Year? 0 points 0 points  What month? 0 points 0 points  What time? 0 points 0 points  Count back from 20 0 points 0 points  Months in reverse 0 points 0 points  Repeat phrase 0 points 0 points  Total Score 0 0    Immunizations Immunization History  Administered Date(s) Administered   Fluad Quad(high Dose 65+) 12/19/2018   Influenza, High Dose Seasonal PF 12/06/2015, 12/24/2017   Influenza-Unspecified 12/18/2016, 12/24/2017   PFIZER(Purple Top)SARS-COV-2 Vaccination 04/25/2019, 05/16/2019, 06/28/2020   Pneumococcal Conjugate-13 01/12/2017   Pneumococcal Polysaccharide-23 09/04/2018   Tdap 12/06/2015    TDAP status: Up to date  Flu Vaccine status: Up to date  Pneumococcal vaccine status: Up to date  Covid-19 vaccine status: Completed vaccines  Qualifies for Shingles Vaccine? Yes   Zostavax completed No   Shingrix Completed?: No.    Education has been provided regarding the importance of this vaccine. Patient has been advised to call insurance company to determine out of pocket expense if they have not yet received this vaccine. Advised may also receive vaccine at local pharmacy or Health Dept. Verbalized acceptance and understanding.  Screening Tests Health Maintenance  Topic Date Due    Zoster Vaccines- Shingrix (1 of 2) Never done   COVID-19 Vaccine (4 - Booster for Pfizer series) 09/27/2020   INFLUENZA VACCINE  10/18/2020   TETANUS/TDAP  12/05/2025   COLONOSCOPY (Pts 45-43yrs Insurance coverage will need to be confirmed)  01/03/2026  Hepatitis C Screening  Completed   PNA vac Low Risk Adult  Completed   HPV VACCINES  Aged Out    Health Maintenance  Health Maintenance Due  Topic Date Due   Zoster Vaccines- Shingrix (1 of 2) Never done   COVID-19 Vaccine (4 - Booster for Pfizer series) 09/27/2020    Colorectal cancer screening: Type of screening: Colonoscopy. Completed 01/04/16. Repeat every 10 years  Additional Screening:  Hepatitis C Screening:  Completed 03/26/17  Vision Screening: Recommended annual ophthalmology exams for early detection of glaucoma and other disorders of the eye. Is the patient up to date with their annual eye exam?  Yes  Who is the provider or what is the name of the office in which the patient attends annual eye exams? Dr Gershon Crane If pt is not established with a provider, would they like to be referred to a provider to establish care? No .   Dental Screening: Recommended annual dental exams for proper oral hygiene  Community Resource Referral / Chronic Care Management: CRR required this visit?  No   CCM required this visit?  No      Plan:     I have personally reviewed and noted the following in the patient's chart:   Medical and social history Use of alcohol, tobacco or illicit drugs  Current medications and supplements including opioid prescriptions. Patient is not currently taking opioid prescriptions. Functional ability and status Nutritional status Physical activity Advanced directives List of other physicians Hospitalizations, surgeries, and ER visits in previous 12 months Vitals Screenings to include cognitive, depression, and falls Referrals and appointments  In addition, I have reviewed and discussed with  patient certain preventive protocols, quality metrics, and best practice recommendations. A written personalized care plan for preventive services as well as general preventive health recommendations were provided to patient.     Willette Brace, LPN   0/30/1499   Nurse Notes: None

## 2020-09-27 NOTE — Patient Instructions (Signed)
Brent Butler , Thank you for taking time to come for your Medicare Wellness Visit. I appreciate your ongoing commitment to your health goals. Please review the following plan we discussed and let me know if I can assist you in the future.   Screening recommendations/referrals: Colonoscopy: Done 01/04/16 repeat in 10 years 01/03/26 Recommended yearly ophthalmology/optometry visit for glaucoma screening and checkup Recommended yearly dental visit for hygiene and checkup  Vaccinations: Influenza vaccine: Due 10/18/20 Pneumococcal vaccine: Completed  Tdap vaccine: Done 12/06/15 repeat in 10 years 12/05/25 Shingles vaccine: Shingrix discussed. Please contact your pharmacy for coverage information.    Covid-19: Completed 2/5 & 05/16/19  Advanced directives: Copies in chart   Conditions/risks identified: stay healthy   Next appointment: Follow up in one year for your annual wellness visit.   Preventive Care 76 Years and Older, Male Preventive care refers to lifestyle choices and visits with your health care provider that can promote health and wellness. What does preventive care include? A yearly physical exam. This is also called an annual well check. Dental exams once or twice a year. Routine eye exams. Ask your health care provider how often you should have your eyes checked. Personal lifestyle choices, including: Daily care of your teeth and gums. Regular physical activity. Eating a healthy diet. Avoiding tobacco and drug use. Limiting alcohol use. Practicing safe sex. Taking low doses of aspirin every day. Taking vitamin and mineral supplements as recommended by your health care provider. What happens during an annual well check? The services and screenings done by your health care provider during your annual well check will depend on your age, overall health, lifestyle risk factors, and family history of disease. Counseling  Your health care provider may ask you questions about  your: Alcohol use. Tobacco use. Drug use. Emotional well-being. Home and relationship well-being. Sexual activity. Eating habits. History of falls. Memory and ability to understand (cognition). Work and work Statistician. Screening  You may have the following tests or measurements: Height, weight, and BMI. Blood pressure. Lipid and cholesterol levels. These may be checked every 5 years, or more frequently if you are over 15 years old. Skin check. Lung cancer screening. You may have this screening every year starting at age 9 if you have a 30-pack-year history of smoking and currently smoke or have quit within the past 15 years. Fecal occult blood test (FOBT) of the stool. You may have this test every year starting at age 43. Flexible sigmoidoscopy or colonoscopy. You may have a sigmoidoscopy every 5 years or a colonoscopy every 10 years starting at age 42. Prostate cancer screening. Recommendations will vary depending on your family history and other risks. Hepatitis C blood test. Hepatitis B blood test. Sexually transmitted disease (STD) testing. Diabetes screening. This is done by checking your blood sugar (glucose) after you have not eaten for a while (fasting). You may have this done every 1-3 years. Abdominal aortic aneurysm (AAA) screening. You may need this if you are a current or former smoker. Osteoporosis. You may be screened starting at age 46 if you are at high risk. Talk with your health care provider about your test results, treatment options, and if necessary, the need for more tests. Vaccines  Your health care provider may recommend certain vaccines, such as: Influenza vaccine. This is recommended every year. Tetanus, diphtheria, and acellular pertussis (Tdap, Td) vaccine. You may need a Td booster every 10 years. Zoster vaccine. You may need this after age 41. Pneumococcal 13-valent conjugate (PCV13)  vaccine. One dose is recommended after age 18. Pneumococcal  polysaccharide (PPSV23) vaccine. One dose is recommended after age 26. Talk to your health care provider about which screenings and vaccines you need and how often you need them. This information is not intended to replace advice given to you by your health care provider. Make sure you discuss any questions you have with your health care provider. Document Released: 04/02/2015 Document Revised: 11/24/2015 Document Reviewed: 01/05/2015 Elsevier Interactive Patient Education  2017 [place] Prevention in the Home Falls can cause injuries. They can happen to people of all ages. There are many things you can do to make your home safe and to help prevent falls. What can I do on the outside of my home? Regularly fix the edges of walkways and driveways and fix any cracks. Remove anything that might make you trip as you walk through a door, such as a raised step or threshold. Trim any bushes or trees on the path to your home. Use bright outdoor lighting. Clear any walking paths of anything that might make someone trip, such as rocks or tools. Regularly check to see if handrails are loose or broken. Make sure that both sides of any steps have handrails. Any raised decks and porches should have guardrails on the edges. Have any leaves, snow, or ice cleared regularly. Use sand or salt on walking paths during winter. Clean up any spills in your garage right away. This includes oil or grease spills. What can I do in the bathroom? Use night lights. Install grab bars by the toilet and in the tub and shower. Do not use towel bars as grab bars. Use non-skid mats or decals in the tub or shower. If you need to sit down in the shower, use a plastic, non-slip stool. Keep the floor dry. Clean up any water that spills on the floor as soon as it happens. Remove soap buildup in the tub or shower regularly. Attach bath mats securely with double-sided non-slip rug tape. Do not have throw rugs and other  things on the floor that can make you trip. What can I do in the bedroom? Use night lights. Make sure that you have a light by your bed that is easy to reach. Do not use any sheets or blankets that are too big for your bed. They should not hang down onto the floor. Have a firm chair that has side arms. You can use this for support while you get dressed. Do not have throw rugs and other things on the floor that can make you trip. What can I do in the kitchen? Clean up any spills right away. Avoid walking on wet floors. Keep items that you use a lot in easy-to-reach places. If you need to reach something above you, use a strong step stool that has a grab bar. Keep electrical cords out of the way. Do not use floor polish or wax that makes floors slippery. If you must use wax, use non-skid floor wax. Do not have throw rugs and other things on the floor that can make you trip. What can I do with my stairs? Do not leave any items on the stairs. Make sure that there are handrails on both sides of the stairs and use them. Fix handrails that are broken or loose. Make sure that handrails are as long as the stairways. Check any carpeting to make sure that it is firmly attached to the stairs. Fix any carpet that is loose  or worn. Avoid having throw rugs at the top or bottom of the stairs. If you do have throw rugs, attach them to the floor with carpet tape. Make sure that you have a light switch at the top of the stairs and the bottom of the stairs. If you do not have them, ask someone to add them for you. What else can I do to help prevent falls? Wear shoes that: Do not have high heels. Have rubber bottoms. Are comfortable and fit you well. Are closed at the toe. Do not wear sandals. If you use a stepladder: Make sure that it is fully opened. Do not climb a closed stepladder. Make sure that both sides of the stepladder are locked into place. Ask someone to hold it for you, if possible. Clearly  mark and make sure that you can see: Any grab bars or handrails. First and last steps. Where the edge of each step is. Use tools that help you move around (mobility aids) if they are needed. These include: Canes. Walkers. Scooters. Crutches. Turn on the lights when you go into a dark area. Replace any light bulbs as soon as they burn out. Set up your furniture so you have a clear path. Avoid moving your furniture around. If any of your floors are uneven, fix them. If there are any pets around you, be aware of where they are. Review your medicines with your doctor. Some medicines can make you feel dizzy. This can increase your chance of falling. Ask your doctor what other things that you can do to help prevent falls. This information is not intended to replace advice given to you by your health care provider. Make sure you discuss any questions you have with your health care provider. Document Released: 12/31/2008 Document Revised: 08/12/2015 Document Reviewed: 04/10/2014 Elsevier Interactive Patient Education  2017 [employer].

## 2020-10-01 NOTE — Progress Notes (Signed)
I have personally reviewed the Medicare Annual Wellness Visit and agree with the documentation.  Brent Butler. Jerline Pain, MD 10/01/2020 11:47 AM

## 2020-10-07 DIAGNOSIS — Z961 Presence of intraocular lens: Secondary | ICD-10-CM | POA: Diagnosis not present

## 2020-11-08 DIAGNOSIS — R972 Elevated prostate specific antigen [PSA]: Secondary | ICD-10-CM | POA: Diagnosis not present

## 2020-11-08 DIAGNOSIS — R3121 Asymptomatic microscopic hematuria: Secondary | ICD-10-CM | POA: Diagnosis not present

## 2020-11-11 DIAGNOSIS — N320 Bladder-neck obstruction: Secondary | ICD-10-CM | POA: Diagnosis not present

## 2020-11-11 DIAGNOSIS — N210 Calculus in bladder: Secondary | ICD-10-CM | POA: Diagnosis not present

## 2020-11-11 DIAGNOSIS — R3121 Asymptomatic microscopic hematuria: Secondary | ICD-10-CM | POA: Diagnosis not present

## 2020-11-11 DIAGNOSIS — R3129 Other microscopic hematuria: Secondary | ICD-10-CM | POA: Diagnosis not present

## 2020-11-11 DIAGNOSIS — N200 Calculus of kidney: Secondary | ICD-10-CM | POA: Diagnosis not present

## 2020-11-15 DIAGNOSIS — R3912 Poor urinary stream: Secondary | ICD-10-CM | POA: Diagnosis not present

## 2020-11-15 DIAGNOSIS — N210 Calculus in bladder: Secondary | ICD-10-CM | POA: Diagnosis not present

## 2020-11-15 DIAGNOSIS — R3121 Asymptomatic microscopic hematuria: Secondary | ICD-10-CM | POA: Diagnosis not present

## 2020-11-15 DIAGNOSIS — R3914 Feeling of incomplete bladder emptying: Secondary | ICD-10-CM | POA: Diagnosis not present

## 2020-11-15 DIAGNOSIS — R972 Elevated prostate specific antigen [PSA]: Secondary | ICD-10-CM | POA: Diagnosis not present

## 2020-11-15 DIAGNOSIS — N401 Enlarged prostate with lower urinary tract symptoms: Secondary | ICD-10-CM | POA: Diagnosis not present

## 2020-12-21 DIAGNOSIS — Z86010 Personal history of colonic polyps: Secondary | ICD-10-CM | POA: Diagnosis not present

## 2020-12-21 DIAGNOSIS — K649 Unspecified hemorrhoids: Secondary | ICD-10-CM | POA: Diagnosis not present

## 2020-12-21 DIAGNOSIS — D123 Benign neoplasm of transverse colon: Secondary | ICD-10-CM | POA: Diagnosis not present

## 2020-12-21 DIAGNOSIS — D122 Benign neoplasm of ascending colon: Secondary | ICD-10-CM | POA: Diagnosis not present

## 2020-12-21 DIAGNOSIS — K5730 Diverticulosis of large intestine without perforation or abscess without bleeding: Secondary | ICD-10-CM | POA: Diagnosis not present

## 2020-12-24 DIAGNOSIS — D122 Benign neoplasm of ascending colon: Secondary | ICD-10-CM | POA: Diagnosis not present

## 2020-12-24 DIAGNOSIS — D123 Benign neoplasm of transverse colon: Secondary | ICD-10-CM | POA: Diagnosis not present

## 2020-12-29 DIAGNOSIS — Z23 Encounter for immunization: Secondary | ICD-10-CM | POA: Diagnosis not present

## 2021-04-06 DIAGNOSIS — R972 Elevated prostate specific antigen [PSA]: Secondary | ICD-10-CM | POA: Diagnosis not present

## 2021-04-06 LAB — PSA: PSA: 5.24

## 2021-04-13 DIAGNOSIS — N5201 Erectile dysfunction due to arterial insufficiency: Secondary | ICD-10-CM | POA: Diagnosis not present

## 2021-04-13 DIAGNOSIS — N401 Enlarged prostate with lower urinary tract symptoms: Secondary | ICD-10-CM | POA: Diagnosis not present

## 2021-04-13 DIAGNOSIS — R3914 Feeling of incomplete bladder emptying: Secondary | ICD-10-CM | POA: Diagnosis not present

## 2021-04-13 DIAGNOSIS — R972 Elevated prostate specific antigen [PSA]: Secondary | ICD-10-CM | POA: Diagnosis not present

## 2021-04-21 ENCOUNTER — Encounter: Payer: Self-pay | Admitting: Family Medicine

## 2021-08-12 ENCOUNTER — Other Ambulatory Visit: Payer: Self-pay | Admitting: Family Medicine

## 2021-08-12 DIAGNOSIS — E7800 Pure hypercholesterolemia, unspecified: Secondary | ICD-10-CM

## 2021-10-03 ENCOUNTER — Ambulatory Visit (INDEPENDENT_AMBULATORY_CARE_PROVIDER_SITE_OTHER): Payer: Medicare Other

## 2021-10-03 DIAGNOSIS — Z0000 Encounter for general adult medical examination without abnormal findings: Secondary | ICD-10-CM

## 2021-10-03 NOTE — Progress Notes (Signed)
Virtual Visit via Telephone Note  I connected with  Brent Butler on 10/03/21 at  9:30 AM EDT by telephone and verified that I am speaking with the correct person using two identifiers.  Medicare Annual Wellness visit completed telephonically due to Covid-19 pandemic.   Persons participating in this call: This Health Coach and this patient.   Location: Patient: home  Provider: office    I discussed the limitations, risks, security and privacy concerns of performing an evaluation and management service by telephone and the availability of in person appointments. The patient expressed understanding and agreed to proceed.  Unable to perform video visit due to video visit attempted and failed and/or patient does not have video capability.   Some vital signs may be absent or patient reported.   Willette Brace, LPN   Subjective:   Brent Butler is a 76 y.o. male who presents for Medicare Annual/Subsequent preventive examination.  Review of Systems     Cardiac Risk Factors include: advanced age (>51mn, >>104women);dyslipidemia;male gender     Objective:    There were no vitals filed for this visit. There is no height or weight on file to calculate BMI.     10/03/2021    9:41 AM 09/27/2020    9:32 AM 09/15/2019    9:14 AM 09/10/2018    3:53 PM 03/06/2017   10:48 AM 09/12/2016    8:48 AM 09/11/2013    6:24 AM  Advanced Directives  Does Patient Have a Medical Advance Directive? Yes Yes Yes No No No Patient does not have advance directive;Patient would not like information  Type of AScientist, forensicPower of AChain O' LakesLiving will      Copy of [place] Chart? Yes - validated most recent copy scanned in chart (See row information) Yes - validated most recent copy scanned in chart (See row information) Yes - validated most recent copy scanned in chart (See row information)      Would patient  like information on creating a medical advance directive?     Yes (MAU/Ambulatory/Procedural Areas - Information given) No - Patient declined     Current Medications (verified) Outpatient Encounter Medications as of 10/03/2021  Medication Sig   doxazosin (CARDURA) 8 MG tablet TAKE 1 TABLET BY MOUTH  DAILY   finasteride (PROSCAR) 5 MG tablet Take 5 mg by mouth daily.   simvastatin (ZOCOR) 20 MG tablet TAKE 1 TABLET BY MOUTH IN  THE EVENING   sildenafil (VIAGRA) 100 MG tablet Take 50 mg by mouth daily as needed. Takes 1/2 tablet as needed for erectile dysfunction (Patient not taking: Reported on 10/03/2021)   [DISCONTINUED] amoxicillin-clavulanate (AUGMENTIN) 875-125 MG tablet SMARTSIG:1 Tablet(s) By Mouth Every 12 Hours   No facility-administered encounter medications on file as of 10/03/2021.    Allergies (verified) Patient has no known allergies.   History: Past Medical History:  Diagnosis Date   Chronic kidney disease    Ulcer of gastric fundus    Past Surgical History:  Procedure Laterality Date   BLADDER TUMOR EXCISION     CYSTOSCOPY N/A 09/11/2013   Procedure: CYSTOSCOPY, FLEXIBLE CYSTOSCOPY,  BLADDER BIOPSY WITH FULGERATION ;  Surgeon: JMalka So MD;  [facility]  Service: Urology;  Laterality: N/A;   LITHOTRIPSY     History reviewed. No pertinent family history. Social History   Socioeconomic History   Marital status: Married    Spouse name:  Not on file   Number of children: Not on file   Years of education: Not on file   Highest education level: Not on file  Occupational History   Occupation: retired    Comment: Naval architect  Tobacco Use   Smoking status: Former    Types: Cigarettes    Quit date: 09/06/2002    Years since quitting: 19.0   Smokeless tobacco: Former    Quit date: 11/19/1998  Vaping Use   Vaping Use: Never used  Substance and Sexual Activity   Alcohol use: Yes    Alcohol/week: 1.0 standard drink of alcohol     Types: 1 Glasses of wine per week   Drug use: No   Sexual activity: Yes  Other Topics Concern   Not on file  Social History Narrative   Not on file   Social Determinants of Health   Financial Resource Strain: Low Risk  (10/03/2021)   Overall Financial Resource Strain (CARDIA)    Difficulty of Paying Living Expenses: Not hard at all  Food Insecurity: No Food Insecurity (10/03/2021)   Hunger Vital Sign    Worried About Running Out of Food in the Last Year: Never true    Ran Out of Food in the Last Year: Never true  Transportation Needs: No Transportation Needs (10/03/2021)   PRAPARE - Hydrologist (Medical): No    Lack of Transportation (Non-Medical): No  Physical Activity: Sufficiently Active (10/03/2021)   Exercise Vital Sign    Days of Exercise per Week: 5 days    Minutes of Exercise per Session: 40 min  Stress: No Stress Concern Present (10/03/2021)   [place]    Feeling of Stress : Not at all  Social Connections: Moderately Isolated (10/03/2021)   Social Connection and Isolation Panel [NHANES]    Frequency of Communication with Friends and Family: Once a week    Frequency of Social Gatherings with Friends and Family: Twice a week    Attends Religious Services: Never    Marine scientist or Organizations: No    Attends Music therapist: Never    Marital Status: Married    Tobacco Counseling Counseling given: Not Answered   Clinical Intake:  Pre-visit preparation completed: Yes  Pain : No/denies pain     BMI - recorded: 29.62 Nutritional Status: BMI 25 -29 Overweight Nutritional Risks: None Diabetes: No  How often do you need to have someone help you when you read instructions, pamphlets, or other written materials from your doctor or pharmacy?: 1 - Never  Diabetic?no  Interpreter Needed?: No  Information entered by :: Charlott Rakes,  LPN   Activities of Daily Living    10/03/2021    9:42 AM  In your present state of health, do you have any difficulty performing the following activities:  Hearing? 0  Vision? 0  Difficulty concentrating or making decisions? 0  Walking or climbing stairs? 0  Dressing or bathing? 0  Doing errands, shopping? 0  Preparing Food and eating ? N  Using the Toilet? N  In the past six months, have you accidently leaked urine? N  Do you have problems with loss of bowel control? N  Managing your Medications? N  Managing your Finances? N  Housekeeping or managing your Housekeeping? N    Patient Care Team: Vivi Barrack, MD as PCP - General (Family Medicine) Rutherford Guys, MD as Consulting Physician (Ophthalmology) [name],  MD as Attending Physician (Urology) Clarene Essex, MD as Consulting Physician (Gastroenterology)  Indicate any recent Medical Services you may have received from other than [facility] providers in the past year (date may be approximate).     Assessment:   This is a routine wellness examination for Abhijay.  Hearing/Vision screen Hearing Screening - Comments:: Pt denies any hearing issues  Vision Screening - Comments:: Pt follows up with Dr Gershon Crane for annual eye exams   Dietary issues and exercise activities discussed: Current Exercise Habits: Home exercise routine, Type of exercise: Other - see comments, Time (Minutes): 40, Frequency (Times/Week): 5, Weekly Exercise (Minutes/Week): 200   Goals Addressed             This Visit's Progress    Patient Stated       Lose 10 - 12 lbs        Depression Screen    10/03/2021    9:40 AM 09/27/2020    9:30 AM 09/15/2019    9:12 AM 12/24/2018    8:02 AM 09/04/2018   11:22 AM 03/06/2017   10:50 AM 11/13/2016    9:15 AM  PHQ 2/9 Scores  PHQ - 2 Score 0 0 0 0 0 0 0  PHQ- 9 Score     0 0     Fall Risk    10/03/2021    9:42 AM 09/27/2020    9:33 AM 09/15/2019    9:15 AM 08/28/2019    9:10 AM 09/04/2018   11:22 AM   Fall Risk   Falls in the past year? 0 0 0 0 0  Number falls in past yr: 0 0 0 0   Injury with Fall? 0 0 0 0   Risk for fall due to : Impaired vision Impaired vision No Fall Risks    Follow up Falls prevention discussed Falls prevention discussed Falls prevention discussed      FALL RISK PREVENTION PERTAINING TO THE HOME:  Any stairs in or around the home? Yes  If so, are there any without handrails? No  Home free of loose throw rugs in walkways, pet beds, electrical cords, etc? Yes  Adequate lighting in your home to reduce risk of falls? Yes   ASSISTIVE DEVICES UTILIZED TO PREVENT FALLS:  Life alert? No  Use of a cane, walker or w/c? No  Grab bars in the bathroom? Yes  Shower chair or bench in shower? Yes  Elevated toilet seat or a handicapped toilet? No   TIMED UP AND GO:  Was the test performed? No .   Cognitive Function:    09/10/2018    3:55 PM  MMSE - Mini Mental State Exam  Orientation to time 5  Orientation to time comments 5  Orientation to Place 5  Registration 3  Attention/ Calculation 5  Recall 3  Language- name 2 objects 2  Language- repeat 1  Language- follow 3 step command 3  Language- read & follow direction 1  Write a sentence 1  Copy design 1  Total score 30        10/03/2021    9:43 AM 09/27/2020    9:53 AM 09/15/2019    9:16 AM  6CIT Screen  What Year? 0 points 0 points 0 points  What month? 0 points 0 points 0 points  What time? 0 points 0 points 0 points  Count back from 20 0 points 0 points 0 points  Months in reverse 0 points 0 points 0 points  Repeat phrase  0 points 0 points 0 points  Total Score 0 points 0 points 0 points    Immunizations Immunization History  Administered Date(s) Administered   Fluad Quad(high Dose 65+) 12/19/2018   Influenza, High Dose Seasonal PF 12/06/2015, 12/24/2017   Influenza-Unspecified 12/18/2016, 12/24/2017   PFIZER(Purple Top)SARS-COV-2 Vaccination 04/25/2019, 05/16/2019, 06/28/2020   Pneumococcal  Conjugate-13 01/12/2017   Pneumococcal Polysaccharide-23 09/04/2018   Tdap 12/06/2015    TDAP status: Up to date  Flu Vaccine status: Up to date  Pneumococcal vaccine status: Up to date  Covid-19 vaccine status: Completed vaccines  Qualifies for Shingles Vaccine? Yes   Zostavax completed No   Shingrix Completed?: No.    Education has been provided regarding the importance of this vaccine. Patient has been advised to call insurance company to determine out of pocket expense if they have not yet received this vaccine. Advised may also receive vaccine at local pharmacy or Health Dept. Verbalized acceptance and understanding.  Screening Tests Health Maintenance  Topic Date Due   Zoster Vaccines- Shingrix (1 of 2) Never done   COVID-19 Vaccine (4 - Booster for Pfizer series) 08/23/2020   INFLUENZA VACCINE  10/18/2021   TETANUS/TDAP  12/05/2025   Pneumonia Vaccine 93+ Years old  Completed   Hepatitis C Screening  Completed   HPV VACCINES  Aged Out   COLONOSCOPY (Pts 45-63yr Insurance coverage will need to be confirmed)  Discontinued    Health Maintenance  Health Maintenance Due  Topic Date Due   Zoster Vaccines- Shingrix (1 of 2) Never done   COVID-19 Vaccine (4 - Booster for Pfizer series) 08/23/2020    Colorectal cancer screening: No longer required.    Additional Screening:  Hepatitis C Screening:  Completed 03/26/17  Vision Screening: Recommended annual ophthalmology exams for early detection of glaucoma and other disorders of the eye. Is the patient up to date with their annual eye exam?  Yes  Who is the provider or what is the name of the office in which the patient attends annual eye exams? Dr SGershon Crane If pt is not established with a provider, would they like to be referred to a provider to establish care? No .   Dental Screening: Recommended annual dental exams for proper oral hygiene  Community Resource Referral / Chronic Care Management: CRR required this  visit?  No   CCM required this visit?  No      Plan:     I have personally reviewed and noted the following in the patient's chart:   Medical and social history Use of alcohol, tobacco or illicit drugs  Current medications and supplements including opioid prescriptions. Patient is not currently taking opioid prescriptions. Functional ability and status Nutritional status Physical activity Advanced directives List of other physicians Hospitalizations, surgeries, and ER visits in previous 12 months Vitals Screenings to include cognitive, depression, and falls Referrals and appointments  In addition, I have reviewed and discussed with patient certain preventive protocols, quality metrics, and best practice recommendations. A written personalized care plan for preventive services as well as general preventive health recommendations were provided to patient.     TWillette Brace LPN   [id number]  Nurse Notes: none

## 2021-10-03 NOTE — Patient Instructions (Signed)
[name] , Thank you for taking time to come for your Medicare Wellness Visit. I appreciate your ongoing commitment to your health goals. Please review the following plan we discussed and let me know if I can assist you in the future.   Screening recommendations/referrals: Colonoscopy: no longer required  Recommended yearly ophthalmology/optometry visit for glaucoma screening and checkup Recommended yearly dental visit for hygiene and checkup  Vaccinations: Influenza vaccine: done 12/29/20 repeat every year  Pneumococcal vaccine: Up to date Tdap vaccine: done 12/06/15 repeat every 10 years  Shingles vaccine: Shingrix discussed. Please contact your pharmacy for coverage information.    Covid-19: Completed 2/5, 05/16/19 & 06/18/20  Advanced directives: copies in chart   Conditions/risks identified: lose 10 - 12 lbs   Next appointment: Follow up in one year for your annual wellness visit.   Preventive Care 77 Years and Older, Male Preventive care refers to lifestyle choices and visits with your health care provider that can promote health and wellness. What does preventive care include? A yearly physical exam. This is also called an annual well check. Dental exams once or twice a year. Routine eye exams. Ask your health care provider how often you should have your eyes checked. Personal lifestyle choices, including: Daily care of your teeth and gums. Regular physical activity. Eating a healthy diet. Avoiding tobacco and drug use. Limiting alcohol use. Practicing safe sex. Taking low doses of aspirin every day. Taking vitamin and mineral supplements as recommended by your health care provider. What happens during an annual well check? The services and screenings done by your health care provider during your annual well check will depend on your age, overall health, lifestyle risk factors, and family history of disease. Counseling  Your health care provider may ask you questions about  your: Alcohol use. Tobacco use. Drug use. Emotional well-being. Home and relationship well-being. Sexual activity. Eating habits. History of falls. Memory and ability to understand (cognition). Work and work Statistician. Screening  You may have the following tests or measurements: Height, weight, and BMI. Blood pressure. Lipid and cholesterol levels. These may be checked every 5 years, or more frequently if you are over 84 years old. Skin check. Lung cancer screening. You may have this screening every year starting at age 42 if you have a 30-pack-year history of smoking and currently smoke or have quit within the past 15 years. Fecal occult blood test (FOBT) of the stool. You may have this test every year starting at age 104. Flexible sigmoidoscopy or colonoscopy. You may have a sigmoidoscopy every 5 years or a colonoscopy every 10 years starting at age 41. Prostate cancer screening. Recommendations will vary depending on your family history and other risks. Hepatitis C blood test. Hepatitis B blood test. Sexually transmitted disease (STD) testing. Diabetes screening. This is done by checking your blood sugar (glucose) after you have not eaten for a while (fasting). You may have this done every 1-3 years. Abdominal aortic aneurysm (AAA) screening. You may need this if you are a current or former smoker. Osteoporosis. You may be screened starting at age 17 if you are at high risk. Talk with your health care provider about your test results, treatment options, and if necessary, the need for more tests. Vaccines  Your health care provider may recommend certain vaccines, such as: Influenza vaccine. This is recommended every year. Tetanus, diphtheria, and acellular pertussis (Tdap, Td) vaccine. You may need a Td booster every 10 years. Zoster vaccine. You may need this after  age 32. Pneumococcal 13-valent conjugate (PCV13) vaccine. One dose is recommended after age 36. Pneumococcal  polysaccharide (PPSV23) vaccine. One dose is recommended after age 28. Talk to your health care provider about which screenings and vaccines you need and how often you need them. This information is not intended to replace advice given to you by your health care provider. Make sure you discuss any questions you have with your health care provider. Document Released: 04/02/2015 Document Revised: 11/24/2015 Document Reviewed: 01/05/2015 Elsevier Interactive Patient Education  2017 [place] Prevention in the Home Falls can cause injuries. They can happen to people of all ages. There are many things you can do to make your home safe and to help prevent falls. What can I do on the outside of my home? Regularly fix the edges of walkways and driveways and fix any cracks. Remove anything that might make you trip as you walk through a door, such as a raised step or threshold. Trim any bushes or trees on the path to your home. Use bright outdoor lighting. Clear any walking paths of anything that might make someone trip, such as rocks or tools. Regularly check to see if handrails are loose or broken. Make sure that both sides of any steps have handrails. Any raised decks and porches should have guardrails on the edges. Have any leaves, snow, or ice cleared regularly. Use sand or salt on walking paths during winter. Clean up any spills in your garage right away. This includes oil or grease spills. What can I do in the bathroom? Use night lights. Install grab bars by the toilet and in the tub and shower. Do not use towel bars as grab bars. Use non-skid mats or decals in the tub or shower. If you need to sit down in the shower, use a plastic, non-slip stool. Keep the floor dry. Clean up any water that spills on the floor as soon as it happens. Remove soap buildup in the tub or shower regularly. Attach bath mats securely with double-sided non-slip rug tape. Do not have throw rugs and other  things on the floor that can make you trip. What can I do in the bedroom? Use night lights. Make sure that you have a light by your bed that is easy to reach. Do not use any sheets or blankets that are too big for your bed. They should not hang down onto the floor. Have a firm chair that has side arms. You can use this for support while you get dressed. Do not have throw rugs and other things on the floor that can make you trip. What can I do in the kitchen? Clean up any spills right away. Avoid walking on wet floors. Keep items that you use a lot in easy-to-reach places. If you need to reach something above you, use a strong step stool that has a grab bar. Keep electrical cords out of the way. Do not use floor polish or wax that makes floors slippery. If you must use wax, use non-skid floor wax. Do not have throw rugs and other things on the floor that can make you trip. What can I do with my stairs? Do not leave any items on the stairs. Make sure that there are handrails on both sides of the stairs and use them. Fix handrails that are broken or loose. Make sure that handrails are as long as the stairways. Check any carpeting to make sure that it is firmly attached to the stairs.  Fix any carpet that is loose or worn. Avoid having throw rugs at the top or bottom of the stairs. If you do have throw rugs, attach them to the floor with carpet tape. Make sure that you have a light switch at the top of the stairs and the bottom of the stairs. If you do not have them, ask someone to add them for you. What else can I do to help prevent falls? Wear shoes that: Do not have high heels. Have rubber bottoms. Are comfortable and fit you well. Are closed at the toe. Do not wear sandals. If you use a stepladder: Make sure that it is fully opened. Do not climb a closed stepladder. Make sure that both sides of the stepladder are locked into place. Ask someone to hold it for you, if possible. Clearly  mark and make sure that you can see: Any grab bars or handrails. First and last steps. Where the edge of each step is. Use tools that help you move around (mobility aids) if they are needed. These include: Canes. Walkers. Scooters. Crutches. Turn on the lights when you go into a dark area. Replace any light bulbs as soon as they burn out. Set up your furniture so you have a clear path. Avoid moving your furniture around. If any of your floors are uneven, fix them. If there are any pets around you, be aware of where they are. Review your medicines with your doctor. Some medicines can make you feel dizzy. This can increase your chance of falling. Ask your doctor what other things that you can do to help prevent falls. This information is not intended to replace advice given to you by your health care provider. Make sure you discuss any questions you have with your health care provider. Document Released: 12/31/2008 Document Revised: 08/12/2015 Document Reviewed: 04/10/2014 Elsevier Interactive Patient Education  2017 [employer].

## 2021-10-05 DIAGNOSIS — R972 Elevated prostate specific antigen [PSA]: Secondary | ICD-10-CM | POA: Diagnosis not present

## 2021-10-05 LAB — PSA: PSA: 4.11

## 2021-10-10 DIAGNOSIS — Z961 Presence of intraocular lens: Secondary | ICD-10-CM | POA: Diagnosis not present

## 2021-10-12 DIAGNOSIS — R3914 Feeling of incomplete bladder emptying: Secondary | ICD-10-CM | POA: Diagnosis not present

## 2021-10-12 DIAGNOSIS — N401 Enlarged prostate with lower urinary tract symptoms: Secondary | ICD-10-CM | POA: Diagnosis not present

## 2021-10-12 DIAGNOSIS — R3121 Asymptomatic microscopic hematuria: Secondary | ICD-10-CM | POA: Diagnosis not present

## 2021-10-19 ENCOUNTER — Other Ambulatory Visit: Payer: Self-pay | Admitting: Urology

## 2021-10-31 ENCOUNTER — Encounter: Payer: Self-pay | Admitting: Family Medicine

## 2021-12-12 ENCOUNTER — Encounter: Payer: Self-pay | Admitting: *Deleted

## 2021-12-27 DIAGNOSIS — Z23 Encounter for immunization: Secondary | ICD-10-CM | POA: Diagnosis not present

## 2022-03-02 ENCOUNTER — Encounter: Payer: Self-pay | Admitting: *Deleted

## 2022-03-27 DIAGNOSIS — M5451 Vertebrogenic low back pain: Secondary | ICD-10-CM | POA: Diagnosis not present

## 2022-03-27 DIAGNOSIS — M9901 Segmental and somatic dysfunction of cervical region: Secondary | ICD-10-CM | POA: Diagnosis not present

## 2022-03-27 DIAGNOSIS — M9903 Segmental and somatic dysfunction of lumbar region: Secondary | ICD-10-CM | POA: Diagnosis not present

## 2022-03-27 DIAGNOSIS — M5416 Radiculopathy, lumbar region: Secondary | ICD-10-CM | POA: Diagnosis not present

## 2022-03-29 DIAGNOSIS — M5416 Radiculopathy, lumbar region: Secondary | ICD-10-CM | POA: Diagnosis not present

## 2022-03-29 DIAGNOSIS — M9903 Segmental and somatic dysfunction of lumbar region: Secondary | ICD-10-CM | POA: Diagnosis not present

## 2022-03-29 DIAGNOSIS — M5451 Vertebrogenic low back pain: Secondary | ICD-10-CM | POA: Diagnosis not present

## 2022-03-29 DIAGNOSIS — M9901 Segmental and somatic dysfunction of cervical region: Secondary | ICD-10-CM | POA: Diagnosis not present

## 2022-04-03 DIAGNOSIS — M9903 Segmental and somatic dysfunction of lumbar region: Secondary | ICD-10-CM | POA: Diagnosis not present

## 2022-04-03 DIAGNOSIS — M5451 Vertebrogenic low back pain: Secondary | ICD-10-CM | POA: Diagnosis not present

## 2022-04-03 DIAGNOSIS — M9901 Segmental and somatic dysfunction of cervical region: Secondary | ICD-10-CM | POA: Diagnosis not present

## 2022-04-03 DIAGNOSIS — M5416 Radiculopathy, lumbar region: Secondary | ICD-10-CM | POA: Diagnosis not present

## 2022-04-05 DIAGNOSIS — M5451 Vertebrogenic low back pain: Secondary | ICD-10-CM | POA: Diagnosis not present

## 2022-04-05 DIAGNOSIS — M9901 Segmental and somatic dysfunction of cervical region: Secondary | ICD-10-CM | POA: Diagnosis not present

## 2022-04-05 DIAGNOSIS — M9903 Segmental and somatic dysfunction of lumbar region: Secondary | ICD-10-CM | POA: Diagnosis not present

## 2022-04-05 DIAGNOSIS — M5416 Radiculopathy, lumbar region: Secondary | ICD-10-CM | POA: Diagnosis not present

## 2022-04-10 DIAGNOSIS — M9903 Segmental and somatic dysfunction of lumbar region: Secondary | ICD-10-CM | POA: Diagnosis not present

## 2022-04-10 DIAGNOSIS — M9901 Segmental and somatic dysfunction of cervical region: Secondary | ICD-10-CM | POA: Diagnosis not present

## 2022-04-10 DIAGNOSIS — M5416 Radiculopathy, lumbar region: Secondary | ICD-10-CM | POA: Diagnosis not present

## 2022-04-10 DIAGNOSIS — M5451 Vertebrogenic low back pain: Secondary | ICD-10-CM | POA: Diagnosis not present

## 2022-04-12 DIAGNOSIS — M5416 Radiculopathy, lumbar region: Secondary | ICD-10-CM | POA: Diagnosis not present

## 2022-04-12 DIAGNOSIS — M9903 Segmental and somatic dysfunction of lumbar region: Secondary | ICD-10-CM | POA: Diagnosis not present

## 2022-04-12 DIAGNOSIS — M9901 Segmental and somatic dysfunction of cervical region: Secondary | ICD-10-CM | POA: Diagnosis not present

## 2022-04-12 DIAGNOSIS — M5451 Vertebrogenic low back pain: Secondary | ICD-10-CM | POA: Diagnosis not present

## 2022-04-17 DIAGNOSIS — M5416 Radiculopathy, lumbar region: Secondary | ICD-10-CM | POA: Diagnosis not present

## 2022-04-17 DIAGNOSIS — M5451 Vertebrogenic low back pain: Secondary | ICD-10-CM | POA: Diagnosis not present

## 2022-04-17 DIAGNOSIS — M9903 Segmental and somatic dysfunction of lumbar region: Secondary | ICD-10-CM | POA: Diagnosis not present

## 2022-04-17 DIAGNOSIS — M9901 Segmental and somatic dysfunction of cervical region: Secondary | ICD-10-CM | POA: Diagnosis not present

## 2022-04-19 DIAGNOSIS — M9903 Segmental and somatic dysfunction of lumbar region: Secondary | ICD-10-CM | POA: Diagnosis not present

## 2022-04-19 DIAGNOSIS — M5451 Vertebrogenic low back pain: Secondary | ICD-10-CM | POA: Diagnosis not present

## 2022-04-19 DIAGNOSIS — M5416 Radiculopathy, lumbar region: Secondary | ICD-10-CM | POA: Diagnosis not present

## 2022-04-19 DIAGNOSIS — M9901 Segmental and somatic dysfunction of cervical region: Secondary | ICD-10-CM | POA: Diagnosis not present

## 2022-04-19 DIAGNOSIS — R972 Elevated prostate specific antigen [PSA]: Secondary | ICD-10-CM | POA: Diagnosis not present

## 2022-04-19 LAB — PSA: PSA: 4.77

## 2022-04-24 DIAGNOSIS — M5451 Vertebrogenic low back pain: Secondary | ICD-10-CM | POA: Diagnosis not present

## 2022-04-24 DIAGNOSIS — M5416 Radiculopathy, lumbar region: Secondary | ICD-10-CM | POA: Diagnosis not present

## 2022-04-24 DIAGNOSIS — M9903 Segmental and somatic dysfunction of lumbar region: Secondary | ICD-10-CM | POA: Diagnosis not present

## 2022-04-24 DIAGNOSIS — M9901 Segmental and somatic dysfunction of cervical region: Secondary | ICD-10-CM | POA: Diagnosis not present

## 2022-04-26 DIAGNOSIS — M5416 Radiculopathy, lumbar region: Secondary | ICD-10-CM | POA: Diagnosis not present

## 2022-04-26 DIAGNOSIS — Z87442 Personal history of urinary calculi: Secondary | ICD-10-CM | POA: Diagnosis not present

## 2022-04-26 DIAGNOSIS — R3121 Asymptomatic microscopic hematuria: Secondary | ICD-10-CM | POA: Diagnosis not present

## 2022-04-26 DIAGNOSIS — M9901 Segmental and somatic dysfunction of cervical region: Secondary | ICD-10-CM | POA: Diagnosis not present

## 2022-04-26 DIAGNOSIS — R350 Frequency of micturition: Secondary | ICD-10-CM | POA: Diagnosis not present

## 2022-04-26 DIAGNOSIS — N401 Enlarged prostate with lower urinary tract symptoms: Secondary | ICD-10-CM | POA: Diagnosis not present

## 2022-04-26 DIAGNOSIS — R3914 Feeling of incomplete bladder emptying: Secondary | ICD-10-CM | POA: Diagnosis not present

## 2022-04-26 DIAGNOSIS — M5451 Vertebrogenic low back pain: Secondary | ICD-10-CM | POA: Diagnosis not present

## 2022-04-26 DIAGNOSIS — R972 Elevated prostate specific antigen [PSA]: Secondary | ICD-10-CM | POA: Diagnosis not present

## 2022-04-26 DIAGNOSIS — M9903 Segmental and somatic dysfunction of lumbar region: Secondary | ICD-10-CM | POA: Diagnosis not present

## 2022-04-26 DIAGNOSIS — Z8551 Personal history of malignant neoplasm of bladder: Secondary | ICD-10-CM | POA: Diagnosis not present

## 2022-05-01 DIAGNOSIS — M9903 Segmental and somatic dysfunction of lumbar region: Secondary | ICD-10-CM | POA: Diagnosis not present

## 2022-05-01 DIAGNOSIS — M5451 Vertebrogenic low back pain: Secondary | ICD-10-CM | POA: Diagnosis not present

## 2022-05-01 DIAGNOSIS — M5416 Radiculopathy, lumbar region: Secondary | ICD-10-CM | POA: Diagnosis not present

## 2022-05-01 DIAGNOSIS — M9901 Segmental and somatic dysfunction of cervical region: Secondary | ICD-10-CM | POA: Diagnosis not present

## 2022-05-03 DIAGNOSIS — M5451 Vertebrogenic low back pain: Secondary | ICD-10-CM | POA: Diagnosis not present

## 2022-05-03 DIAGNOSIS — M5416 Radiculopathy, lumbar region: Secondary | ICD-10-CM | POA: Diagnosis not present

## 2022-05-03 DIAGNOSIS — M9903 Segmental and somatic dysfunction of lumbar region: Secondary | ICD-10-CM | POA: Diagnosis not present

## 2022-05-03 DIAGNOSIS — M9901 Segmental and somatic dysfunction of cervical region: Secondary | ICD-10-CM | POA: Diagnosis not present

## 2022-05-08 ENCOUNTER — Encounter: Payer: Self-pay | Admitting: Family Medicine

## 2022-05-10 DIAGNOSIS — M9901 Segmental and somatic dysfunction of cervical region: Secondary | ICD-10-CM | POA: Diagnosis not present

## 2022-05-10 DIAGNOSIS — M5416 Radiculopathy, lumbar region: Secondary | ICD-10-CM | POA: Diagnosis not present

## 2022-05-10 DIAGNOSIS — M5451 Vertebrogenic low back pain: Secondary | ICD-10-CM | POA: Diagnosis not present

## 2022-05-10 DIAGNOSIS — M9903 Segmental and somatic dysfunction of lumbar region: Secondary | ICD-10-CM | POA: Diagnosis not present

## 2022-05-17 DIAGNOSIS — M5451 Vertebrogenic low back pain: Secondary | ICD-10-CM | POA: Diagnosis not present

## 2022-05-17 DIAGNOSIS — M5416 Radiculopathy, lumbar region: Secondary | ICD-10-CM | POA: Diagnosis not present

## 2022-05-17 DIAGNOSIS — M9901 Segmental and somatic dysfunction of cervical region: Secondary | ICD-10-CM | POA: Diagnosis not present

## 2022-05-17 DIAGNOSIS — M9903 Segmental and somatic dysfunction of lumbar region: Secondary | ICD-10-CM | POA: Diagnosis not present

## 2022-05-18 ENCOUNTER — Other Ambulatory Visit: Payer: Self-pay | Admitting: Urology

## 2022-05-24 DIAGNOSIS — M9903 Segmental and somatic dysfunction of lumbar region: Secondary | ICD-10-CM | POA: Diagnosis not present

## 2022-05-24 DIAGNOSIS — M5451 Vertebrogenic low back pain: Secondary | ICD-10-CM | POA: Diagnosis not present

## 2022-05-24 DIAGNOSIS — M5416 Radiculopathy, lumbar region: Secondary | ICD-10-CM | POA: Diagnosis not present

## 2022-05-24 DIAGNOSIS — M9901 Segmental and somatic dysfunction of cervical region: Secondary | ICD-10-CM | POA: Diagnosis not present

## 2022-05-31 DIAGNOSIS — M5416 Radiculopathy, lumbar region: Secondary | ICD-10-CM | POA: Diagnosis not present

## 2022-05-31 DIAGNOSIS — M5451 Vertebrogenic low back pain: Secondary | ICD-10-CM | POA: Diagnosis not present

## 2022-05-31 DIAGNOSIS — M9903 Segmental and somatic dysfunction of lumbar region: Secondary | ICD-10-CM | POA: Diagnosis not present

## 2022-05-31 DIAGNOSIS — M9901 Segmental and somatic dysfunction of cervical region: Secondary | ICD-10-CM | POA: Diagnosis not present

## 2022-06-07 DIAGNOSIS — M5451 Vertebrogenic low back pain: Secondary | ICD-10-CM | POA: Diagnosis not present

## 2022-06-07 DIAGNOSIS — M9901 Segmental and somatic dysfunction of cervical region: Secondary | ICD-10-CM | POA: Diagnosis not present

## 2022-06-07 DIAGNOSIS — M9903 Segmental and somatic dysfunction of lumbar region: Secondary | ICD-10-CM | POA: Diagnosis not present

## 2022-06-07 DIAGNOSIS — M5416 Radiculopathy, lumbar region: Secondary | ICD-10-CM | POA: Diagnosis not present

## 2022-06-14 DIAGNOSIS — M5137 Other intervertebral disc degeneration, lumbosacral region: Secondary | ICD-10-CM | POA: Diagnosis not present

## 2022-06-14 DIAGNOSIS — M9903 Segmental and somatic dysfunction of lumbar region: Secondary | ICD-10-CM | POA: Diagnosis not present

## 2022-06-14 DIAGNOSIS — M9905 Segmental and somatic dysfunction of pelvic region: Secondary | ICD-10-CM | POA: Diagnosis not present

## 2022-06-14 DIAGNOSIS — M5416 Radiculopathy, lumbar region: Secondary | ICD-10-CM | POA: Diagnosis not present

## 2022-06-28 DIAGNOSIS — M9903 Segmental and somatic dysfunction of lumbar region: Secondary | ICD-10-CM | POA: Diagnosis not present

## 2022-06-28 DIAGNOSIS — M5416 Radiculopathy, lumbar region: Secondary | ICD-10-CM | POA: Diagnosis not present

## 2022-06-28 DIAGNOSIS — M5137 Other intervertebral disc degeneration, lumbosacral region: Secondary | ICD-10-CM | POA: Diagnosis not present

## 2022-06-28 DIAGNOSIS — M9905 Segmental and somatic dysfunction of pelvic region: Secondary | ICD-10-CM | POA: Diagnosis not present

## 2022-07-12 DIAGNOSIS — M5416 Radiculopathy, lumbar region: Secondary | ICD-10-CM | POA: Diagnosis not present

## 2022-07-12 DIAGNOSIS — M5137 Other intervertebral disc degeneration, lumbosacral region: Secondary | ICD-10-CM | POA: Diagnosis not present

## 2022-07-12 DIAGNOSIS — M9905 Segmental and somatic dysfunction of pelvic region: Secondary | ICD-10-CM | POA: Diagnosis not present

## 2022-07-12 DIAGNOSIS — M9903 Segmental and somatic dysfunction of lumbar region: Secondary | ICD-10-CM | POA: Diagnosis not present

## 2022-07-13 ENCOUNTER — Other Ambulatory Visit: Payer: Self-pay | Admitting: Family Medicine

## 2022-07-13 DIAGNOSIS — E7800 Pure hypercholesterolemia, unspecified: Secondary | ICD-10-CM

## 2022-07-21 ENCOUNTER — Ambulatory Visit (INDEPENDENT_AMBULATORY_CARE_PROVIDER_SITE_OTHER): Payer: Medicare Other | Admitting: Family Medicine

## 2022-07-21 VITALS — BP 160/94 | HR 94 | Temp 97.5°F | Ht 67.0 in | Wt 205.2 lb

## 2022-07-21 DIAGNOSIS — E785 Hyperlipidemia, unspecified: Secondary | ICD-10-CM

## 2022-07-21 DIAGNOSIS — N400 Enlarged prostate without lower urinary tract symptoms: Secondary | ICD-10-CM | POA: Diagnosis not present

## 2022-07-21 DIAGNOSIS — R739 Hyperglycemia, unspecified: Secondary | ICD-10-CM | POA: Diagnosis not present

## 2022-07-21 DIAGNOSIS — E7800 Pure hypercholesterolemia, unspecified: Secondary | ICD-10-CM | POA: Diagnosis not present

## 2022-07-21 DIAGNOSIS — R030 Elevated blood-pressure reading, without diagnosis of hypertension: Secondary | ICD-10-CM

## 2022-07-21 LAB — CBC
Hemoglobin: 14.4 g/dL (ref 13.2–17.1)
MCH: 32.1 pg (ref 27.0–33.0)

## 2022-07-21 MED ORDER — SIMVASTATIN 20 MG PO TABS: 20.0000 mg | ORAL_TABLET | Freq: Every evening | ORAL | 3 refills | Status: DC

## 2022-07-21 NOTE — Assessment & Plan Note (Signed)
Check lipids.  Will refill simvastatin 20 mg daily.  We discussed lifestyle modifications.

## 2022-07-21 NOTE — Patient Instructions (Addendum)
It was very nice to see you today!  We will check blood work.   Please continue to work on diet and exercise.  Please keep an eye on your blood pressure and let us know if it is persistently elevated at home.  No follow-ups on file.   Take care, Dr Jimmey Ralph  PLEASE NOTE:  If you had any lab tests, please let us know if you have not heard back within a few days. You may see your results on mychart before we have a chance to review them but we will give you a call once they are reviewed by [place].   If we ordered any referrals today, please let us know if you have not heard from their office within the next week.   If you had any urgent prescriptions sent in today, please check with the pharmacy within an hour of our visit to make sure the prescription was transmitted appropriately.   Please try these tips to maintain a healthy lifestyle:  Eat at least 3 REAL meals and 1-2 snacks per day.  Aim for no more than 5 hours between eating.  If you eat breakfast, please do so within one hour of getting up.   Each meal should contain half fruits/vegetables, one quarter protein, and one quarter carbs (no bigger than a computer mouse)  Cut down on sweet beverages. This includes juice, soda, and sweet tea.   Drink at least 1 glass of water with each meal and aim for at least 8 glasses per day  Exercise at least 150 minutes every week.

## 2022-07-21 NOTE — Progress Notes (Signed)
Brent Butler is a 77 y.o. male who presents today for an office visit.  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  His home readings are in the 120s to 130s.  His home blood pressure cuff correlates well to the blood pressure cuff we are getting here in the office.  He will continue to monitor at home and let us know if persistently elevated at home.  BPH (benign prostatic hyperplasia) Follows with urology for this.  Currently on Cardura 8 mg dail and finasteride 5 mg daily  Dyslipidemia Check lipids.  Will refill simvastatin 20 mg daily.  We discussed lifestyle modifications.  Preventative health care Up-to-date on cancer screening.  Follows with GI.  Last colonoscopy was 2 years ago.  Will need to obtain these records.  He can get shingles vaccine at the pharmacy.  Will check labs today.    Subjective:  HPI:  See A/P for status of chronic conditions.  Patient is here today for annual exam.  He has no acute concerns today. It has been about 3 years since his last visit.  He has been doing well.  He feels like he is doing well. Exercises daily. Diet is balanced with plenty of fruits and vegetables.   ROS: Per HPI, otherwise a complete review of systems was negative.   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  Medications- reviewed and updated Current Outpatient Medications  Medication Sig Dispense Refill   doxazosin (CARDURA) 8 MG tablet TAKE 1 TABLET BY MOUTH  DAILY 90 tablet 3   finasteride (PROSCAR) 5 MG tablet Take 5 mg by mouth daily.     sildenafil (VIAGRA) 100 MG tablet Take 50 mg by mouth daily as needed. Takes 1/2 tablet as needed for erectile dysfunction     simvastatin (ZOCOR) 20 MG tablet Take 1 tablet (20 mg total) by mouth every evening. 90 tablet 3   No current facility-administered medications for this visit.    Allergies-reviewed and updated No Known Allergies  Social History   Socioeconomic History   Marital status: Married    Spouse name: Not on file   Number of children: Not on file   Years of education: Not on file   Highest education level: Not on file  Occupational History   Occupation: retired    Comment: Administrator, arts  Tobacco Use   Smoking status: Former    Types: Cigarettes    Quit date: 09/06/2002    Years since quitting: 19.8   Smokeless tobacco: Former    Quit date: 11/19/1998  Vaping Use   Vaping Use: Never used  Substance and Sexual Activity   Alcohol use: Yes    Alcohol/week: 1.0 standard drink of alcohol  Types: 1 Glasses of wine per week   Drug use: No   Sexual activity: Yes  Other Topics Concern   Not on file  Social History Narrative   Not on file   Social Determinants of Health   Financial Resource Strain: Low Risk  (10/03/2021)   Overall Financial Resource Strain (CARDIA)    Difficulty of Paying Living Expenses: Not hard at all  Food Insecurity: No Food Insecurity (10/03/2021)   Hunger Vital Sign    Worried About Running Out of Food in the Last Year: Never true    Ran Out of Food in the Last Year: Never true  Transportation Needs: No Transportation Needs (10/03/2021)   PRAPARE - Administrator, Civil Service (Medical): No     Lack of Transportation (Non-Medical): No  Physical Activity: Sufficiently Active (10/03/2021)   Exercise Vital Sign    Days of Exercise per Week: 5 days    Minutes of Exercise per Session: 40 min  Stress: No Stress Concern Present (10/03/2021)   [employer] of Occupational Health - Occupational Stress Questionnaire    Feeling of Stress : Not at all  Social Connections: Moderately Isolated (10/03/2021)   Social Connection and Isolation Panel [NHANES]    Frequency of Communication with Friends and Family: Once a week    Frequency of Social Gatherings with Friends and Family: Twice a week    Attends Religious Services: Never    Database administrator or Organizations: No    Attends Engineer, structural: Never    Marital Status: Married          Objective:  Physical Exam: BP (!) 160/94   Pulse 94   Temp (!) 97.5 F (36.4 C) (Temporal)   Ht 5\' 7"  (1.702 m)   Wt 205 lb 3.2 oz (93.1 kg)   SpO2 100%   BMI 32.14 kg/m   Gen: No acute distress, resting comfortably CV: Regular rate and rhythm with no murmurs appreciated Pulm: Normal work of breathing, clear to auscultation bilaterally with no crackles, wheezes, or rhonchi Neuro: Grossly normal, moves all extremities Psych: Normal affect and thought content      Trayce Maino M. Jimmey Ralph, MD 07/21/2022 2:16 PM

## 2022-07-21 NOTE — Assessment & Plan Note (Signed)
Elevated today.  His home readings are in the 120s to 130s.  His home blood pressure cuff correlates well to the blood pressure cuff we are getting here in the office.  He will continue to monitor at home and let us know if persistently elevated at home.

## 2022-07-21 NOTE — Assessment & Plan Note (Signed)
Follows with urology for this.  Currently on Cardura 8 mg dail and finasteride 5 mg daily

## 2022-07-22 LAB — COMPREHENSIVE METABOLIC PANEL
AG Ratio: 1.7 (calc) (ref 1.0–2.5)
ALT: 19 U/L (ref 9–46)
AST: 19 U/L (ref 10–35)
Albumin: 4.2 g/dL (ref 3.6–5.1)
Alkaline phosphatase (APISO): 73 U/L (ref 35–144)
BUN: 15 mg/dL (ref 7–25)
CO2: 25 mmol/L (ref 20–32)
Calcium: 9.3 mg/dL (ref 8.6–10.3)
Chloride: 105 mmol/L (ref 98–110)
Creat: 1.13 mg/dL (ref 0.70–1.28)
Globulin: 2.5 g/dL (calc) (ref 1.9–3.7)
Glucose, Bld: 113 mg/dL — ABNORMAL HIGH (ref 65–99)
Potassium: 3.7 mmol/L (ref 3.5–5.3)
Sodium: 137 mmol/L (ref 135–146)
Total Bilirubin: 0.4 mg/dL (ref 0.2–1.2)
Total Protein: 6.7 g/dL (ref 6.1–8.1)

## 2022-07-22 LAB — LIPID PANEL
Cholesterol: 127 mg/dL (ref <200)
HDL: 35 mg/dL — ABNORMAL LOW (ref >=40)
LDL Cholesterol (Calc): 62 mg/dL (calc)
Non-HDL Cholesterol (Calc): 92 mg/dL (calc) (ref <130)
Total CHOL/HDL Ratio: 3.6 (calc) (ref <5.0)
Triglycerides: 253 mg/dL — ABNORMAL HIGH (ref <150)

## 2022-07-22 LAB — CBC
HCT: 42.7 % (ref 38.5–50.0)
MCHC: 33.7 g/dL (ref 32.0–36.0)
MCV: 95.3 fL (ref 80.0–100.0)
MPV: 10.4 fL (ref 7.5–12.5)
Platelets: 182 10*3/uL (ref 140–400)
RBC: 4.48 10*6/uL (ref 4.20–5.80)
RDW: 13.2 % (ref 11.0–15.0)
WBC: 6.2 10*3/uL (ref 3.8–10.8)

## 2022-07-22 LAB — HEMOGLOBIN A1C
Hgb A1c MFr Bld: 6 % of total Hgb — ABNORMAL HIGH (ref <5.7)
Mean Plasma Glucose: 126 mg/dL
eAG (mmol/L): 7 mmol/L

## 2022-07-22 LAB — TSH: TSH: 0.76 mIU/L (ref 0.40–4.50)

## 2022-07-25 ENCOUNTER — Encounter: Payer: Self-pay | Admitting: Family Medicine

## 2022-07-25 DIAGNOSIS — R7303 Prediabetes: Secondary | ICD-10-CM | POA: Insufficient documentation

## 2022-07-25 NOTE — Progress Notes (Signed)
Blood sugar is in the prediabetic range but the rest of his labs are all stable.  Do not need to make any changes to his treatment plan at this time.  He should continue to work on diet and exercise and we can recheck everything in a year.

## 2022-09-21 ENCOUNTER — Other Ambulatory Visit: Payer: Self-pay | Admitting: Urology

## 2022-10-23 DIAGNOSIS — R972 Elevated prostate specific antigen [PSA]: Secondary | ICD-10-CM | POA: Diagnosis not present

## 2022-10-23 LAB — PSA: PSA: 6.99

## 2022-10-30 DIAGNOSIS — R3912 Poor urinary stream: Secondary | ICD-10-CM | POA: Diagnosis not present

## 2022-10-30 DIAGNOSIS — R972 Elevated prostate specific antigen [PSA]: Secondary | ICD-10-CM | POA: Diagnosis not present

## 2022-10-30 DIAGNOSIS — N210 Calculus in bladder: Secondary | ICD-10-CM | POA: Diagnosis not present

## 2022-10-30 DIAGNOSIS — N401 Enlarged prostate with lower urinary tract symptoms: Secondary | ICD-10-CM | POA: Diagnosis not present

## 2022-10-30 DIAGNOSIS — N5201 Erectile dysfunction due to arterial insufficiency: Secondary | ICD-10-CM | POA: Diagnosis not present

## 2022-10-30 DIAGNOSIS — R3121 Asymptomatic microscopic hematuria: Secondary | ICD-10-CM | POA: Diagnosis not present

## 2022-10-30 DIAGNOSIS — N323 Diverticulum of bladder: Secondary | ICD-10-CM | POA: Diagnosis not present

## 2022-10-30 DIAGNOSIS — Z8551 Personal history of malignant neoplasm of bladder: Secondary | ICD-10-CM | POA: Diagnosis not present

## 2022-10-31 ENCOUNTER — Encounter: Payer: Self-pay | Admitting: Family Medicine

## 2022-10-31 DIAGNOSIS — H524 Presbyopia: Secondary | ICD-10-CM | POA: Diagnosis not present

## 2022-10-31 DIAGNOSIS — H5203 Hypermetropia, bilateral: Secondary | ICD-10-CM | POA: Diagnosis not present

## 2022-10-31 DIAGNOSIS — H26493 Other secondary cataract, bilateral: Secondary | ICD-10-CM | POA: Diagnosis not present

## 2022-10-31 DIAGNOSIS — Z961 Presence of intraocular lens: Secondary | ICD-10-CM | POA: Diagnosis not present

## 2022-10-31 DIAGNOSIS — H52203 Unspecified astigmatism, bilateral: Secondary | ICD-10-CM | POA: Diagnosis not present

## 2022-11-21 DIAGNOSIS — R972 Elevated prostate specific antigen [PSA]: Secondary | ICD-10-CM | POA: Diagnosis not present

## 2023-01-05 DIAGNOSIS — Z23 Encounter for immunization: Secondary | ICD-10-CM | POA: Diagnosis not present

## 2023-01-25 ENCOUNTER — Ambulatory Visit: Payer: Medicare Other

## 2023-01-25 VITALS — Wt 205.0 lb

## 2023-01-25 DIAGNOSIS — Z0000 Encounter for general adult medical examination without abnormal findings: Secondary | ICD-10-CM | POA: Diagnosis not present

## 2023-01-25 NOTE — Patient Instructions (Signed)
Mr. Hochstetler , Thank you for taking time to come for your Medicare Wellness Visit. I appreciate your ongoing commitment to your health goals. Please review the following plan we discussed and let me know if I can assist you in the future.   Referrals/Orders/Follow-Ups/Clinician Recommendations: continue to lose weight  Aim for 30 minutes of exercise or brisk walking, 6-8 glasses of water, and 5 servings of fruits and vegetables each day.   This is a list of the screening recommended for you and due dates:  Health Maintenance  Topic Date Due   Zoster (Shingles) Vaccine (1 of 2) Never done   Flu Shot  10/19/2022   COVID-19 Vaccine (4 - 2023-24 season) 11/19/2022   Medicare Annual Wellness Visit  01/25/2024   DTaP/Tdap/Td vaccine (2 - Td or Tdap) 12/05/2025   Pneumonia Vaccine  Completed   Hepatitis C Screening  Completed   HPV Vaccine  Aged Out   Colon Cancer Screening  Discontinued    Advanced directives: (In Chart) A copy of your advanced directives are scanned into your chart should your provider ever need it.  Next Medicare Annual Wellness Visit scheduled for next year: Yes

## 2023-01-25 NOTE — Progress Notes (Signed)
Subjective:   Brent Butler is a 77 y.o. male who presents for Medicare Annual/Subsequent preventive examination.  Visit Complete: Virtual I connected with  ILYAAS MUSTO on 01/25/23 by a audio enabled telemedicine application and verified that I am speaking with the correct person using two identifiers.  Patient Location: Home  Provider Location: Office/Clinic  I discussed the limitations of evaluation and management by telemedicine. The patient expressed understanding and agreed to proceed.  Vital Signs: Because this visit was a virtual/telehealth visit, some criteria may be missing or patient reported. Any vitals not documented were not able to be obtained and vitals that have been documented are patient reported.   Cardiac Risk Factors include: advanced age (>110men, >60 women);dyslipidemia;hypertension;male gender;obesity (BMI >30kg/m2)     Objective:    Today's Vitals   01/25/23 1434  Weight: 205 lb (93 kg)   Body mass index is 32.11 kg/m.     01/25/2023    2:36 PM 10/03/2021    9:41 AM 09/27/2020    9:32 AM 09/15/2019    9:14 AM 09/10/2018    3:53 PM 03/06/2017   10:48 AM 09/12/2016    8:48 AM  Advanced Directives  Does Patient Have a Medical Advance Directive? Yes Yes Yes Yes No No No  Type of Estate agent of [place];Living will Healthcare Power of [employer] Power of [employer] Power of [place];Living will     Does patient want to make changes to medical advance directive? No - Patient declined        Copy of Healthcare Power of Attorney in Chart? Yes - validated most recent copy scanned in chart (See row information) Yes - validated most recent copy scanned in chart (See row information) Yes - validated most recent copy scanned in chart (See row information) Yes - validated most recent copy scanned in chart (See row information)     Would patient like information on creating a medical advance directive?      Yes  (MAU/Ambulatory/Procedural Areas - Information given) No - Patient declined    Current Medications (verified) Outpatient Encounter Medications as of 01/25/2023  Medication Sig   doxazosin (CARDURA) 8 MG tablet TAKE 1 TABLET BY MOUTH  DAILY   finasteride (PROSCAR) 5 MG tablet Take 5 mg by mouth daily.   sildenafil (VIAGRA) 100 MG tablet Take 50 mg by mouth daily as needed. Takes 1/2 tablet as needed for erectile dysfunction   simvastatin (ZOCOR) 20 MG tablet Take 1 tablet (20 mg total) by mouth every evening.   No facility-administered encounter medications on file as of 01/25/2023.    Allergies (verified) Patient has no known allergies.   History: Past Medical History:  Diagnosis Date   Chronic kidney disease    Ulcer of gastric fundus    Past Surgical History:  Procedure Laterality Date   BLADDER TUMOR EXCISION     CYSTOSCOPY N/A 09/11/2013   Procedure: CYSTOSCOPY, FLEXIBLE CYSTOSCOPY,  BLADDER BIOPSY WITH FULGERATION ;  Surgeon: Anner Crete, MD;  Location: [facility];  Service: Urology;  Laterality: N/A;   LITHOTRIPSY     History reviewed. No pertinent family history. Social History   Socioeconomic History   Marital status: Married    Spouse name: Not on file   Number of children: Not on file   Years of education: Not on file   Highest education level: Not on file  Occupational History   Occupation: retired    Comment: Administrator, arts  Tobacco Use   Smoking status: Former    Current packs/day: 0.00    Types: Cigarettes    Quit date: 09/06/2002    Years since quitting: 20.4   Smokeless tobacco: Former    Quit date: 11/19/1998  Vaping Use   Vaping status: Never Used  Substance and Sexual Activity   Alcohol use: Yes    Alcohol/week: 1.0 standard drink of alcohol    Types: 1 Glasses of wine per week   Drug use: No   Sexual activity: Yes  Other Topics Concern   Not on file  Social History Narrative   Not on file   Social Determinants of  Health   Financial Resource Strain: Low Risk  (01/25/2023)   Overall Financial Resource Strain (CARDIA)    Difficulty of Paying Living Expenses: Not hard at all  Food Insecurity: No Food Insecurity (01/25/2023)   Hunger Vital Sign    Worried About Running Out of Food in the Last Year: Never true    Ran Out of Food in the Last Year: Never true  Transportation Needs: No Transportation Needs (01/25/2023)   PRAPARE - Administrator, Civil Service (Medical): No    Lack of Transportation (Non-Medical): No  Physical Activity: Sufficiently Active (01/25/2023)   Exercise Vital Sign    Days of Exercise per Week: 5 days    Minutes of Exercise per Session: 40 min  Stress: No Stress Concern Present (01/25/2023)   [employer] of Occupational Health - Occupational Stress Questionnaire    Feeling of Stress : Not at all  Social Connections: Moderately Integrated (01/25/2023)   Social Connection and Isolation Panel [NHANES]    Frequency of Communication with Friends and Family: Twice a week    Frequency of Social Gatherings with Friends and Family: More than three times a week    Attends Religious Services: More than 4 times per year    Active Member of [employer] or Organizations: No    Attends Engineer, structural: Never    Marital Status: Married    Tobacco Counseling Counseling given: Not Answered   Clinical Intake:  Pre-visit preparation completed: Yes  Pain : No/denies pain     BMI - recorded: 32.11 Nutritional Status: BMI > 30  Obese Nutritional Risks: None Diabetes: No  How often do you need to have someone help you when you read instructions, pamphlets, or other written materials from your doctor or pharmacy?: 1 - Never  Interpreter Needed?: No  Information entered by :: Lanier Ensign, LPN   Activities of Daily Living    01/25/2023    2:35 PM  In your present state of health, do you have any difficulty performing the following activities:  Hearing? 0   Vision? 0  Difficulty concentrating or making decisions? 0  Walking or climbing stairs? 0  Dressing or bathing? 0  Doing errands, shopping? 0  Preparing Food and eating ? N  Using the Toilet? N  In the past six months, have you accidently leaked urine? N  Do you have problems with loss of bowel control? N  Managing your Medications? N  Managing your Finances? N  Housekeeping or managing your Housekeeping? N    Patient Care Team: Ardith Dark, MD as PCP - General (Family Medicine) Jethro Bolus, MD as Consulting Physician (Ophthalmology) Bjorn Pippin, MD as Attending Physician (Urology) Vida Rigger, MD as Consulting Physician (Gastroenterology)  Indicate any recent Medical Services you may have received from other than [facility] providers in  the past year (date may be approximate).     Assessment:   This is a routine wellness examination for Eyan.  Hearing/Vision screen Hearing Screening - Comments:: Pt denies any hearing issues  Vision Screening - Comments:: Pt follows up with provider for annual eye exams    Goals Addressed             This Visit's Progress    Patient Stated       Lose weight        Depression Screen    01/25/2023    2:36 PM 07/21/2022    1:31 PM 10/03/2021    9:40 AM 09/27/2020    9:30 AM 09/15/2019    9:12 AM 12/24/2018    8:02 AM 09/04/2018   11:22 AM  PHQ 2/9 Scores  PHQ - 2 Score 0 0 0 0 0 0 0  PHQ- 9 Score       0    Fall Risk    01/25/2023    2:39 PM 07/21/2022    1:31 PM 10/03/2021    9:42 AM 09/27/2020    9:33 AM 09/15/2019    9:15 AM  Fall Risk   Falls in the past year? 0 0 0 0 0  Number falls in past yr: 0 0 0 0 0  Injury with Fall? 0 0 0 0 0  Risk for fall due to : No Fall Risks No Fall Risks Impaired vision Impaired vision No Fall Risks  Follow up Falls prevention discussed  Falls prevention discussed Falls prevention discussed Falls prevention discussed    MEDICARE RISK AT HOME: Medicare Risk at Home Any stairs in or around  the home?: Yes If so, are there any without handrails?: No Home free of loose throw rugs in walkways, pet beds, electrical cords, etc?: Yes Adequate lighting in your home to reduce risk of falls?: Yes Life alert?: No Use of a cane, walker or w/c?: No Grab bars in the bathroom?: Yes Shower chair or bench in shower?: Yes Elevated toilet seat or a handicapped toilet?: Yes  TIMED UP AND GO:  Was the test performed?  No    Cognitive Function:    09/10/2018    3:55 PM  MMSE - Mini Mental State Exam  Orientation to time 5  Orientation to time comments 5  Orientation to Place 5  Registration 3  Attention/ Calculation 5  Recall 3  Language- name 2 objects 2  Language- repeat 1  Language- follow 3 step command 3  Language- read & follow direction 1  Write a sentence 1  Copy design 1  Total score 30        01/25/2023    2:39 PM 10/03/2021    9:43 AM 09/27/2020    9:53 AM 09/15/2019    9:16 AM  6CIT Screen  What Year? 0 points 0 points 0 points 0 points  What month? 0 points 0 points 0 points 0 points  What time? 0 points 0 points 0 points 0 points  Count back from 20 0 points 0 points 0 points 0 points  Months in reverse 0 points 0 points 0 points 0 points  Repeat phrase 0 points 0 points 0 points 0 points  Total Score 0 points 0 points 0 points 0 points    Immunizations Immunization History  Administered Date(s) Administered   Fluad Quad(high Dose 65+) 12/19/2018   Influenza, High Dose Seasonal PF 12/06/2015, 12/24/2017   Influenza-Unspecified 12/18/2016, 12/24/2017, 01/05/2023   PFIZER(Purple  Top)SARS-COV-2 Vaccination 04/25/2019, 05/16/2019, 06/28/2020   Pneumococcal Conjugate-13 01/12/2017   Pneumococcal Polysaccharide-23 09/04/2018   Tdap 12/06/2015    TDAP status: Up to date  Flu Vaccine status: Up to date  Pneumococcal vaccine status: Up to date  Covid-19 vaccine status: Information provided on how to obtain vaccines.   Qualifies for Shingles Vaccine? Yes    Zostavax completed No   Shingrix Completed?: No.    Education has been provided regarding the importance of this vaccine. Patient has been advised to call insurance company to determine out of pocket expense if they have not yet received this vaccine. Advised may also receive vaccine at local pharmacy or Health Dept. Verbalized acceptance and understanding.  Screening Tests Health Maintenance  Topic Date Due   Zoster Vaccines- Shingrix (1 of 2) Never done   COVID-19 Vaccine (4 - 2023-24 season) 11/19/2022   Medicare Annual Wellness (AWV)  01/25/2024   DTaP/Tdap/Td (2 - Td or Tdap) 12/05/2025   Pneumonia Vaccine 21+ Years old  Completed   INFLUENZA VACCINE  Completed   Hepatitis C Screening  Completed   HPV VACCINES  Aged Out   Colonoscopy  Discontinued    Health Maintenance  Health Maintenance Due  Topic Date Due   Zoster Vaccines- Shingrix (1 of 2) Never done   COVID-19 Vaccine (4 - 2023-24 season) 11/19/2022    Colorectal cancer screening: No longer required.    Additional Screening:  Hepatitis C Screening:  Completed 03/26/17  Vision Screening: Recommended annual ophthalmology exams for early detection of glaucoma and other disorders of the eye. Is the patient up to date with their annual eye exam?  Yes  Who is the provider or what is the name of the office in which the patient attends annual eye exams? [facility] ophthalmology  If pt is not established with a provider, would they like to be referred to a provider to establish care? No .   Dental Screening: Recommended annual dental exams for proper oral hygiene   Community Resource Referral / Chronic Care Management: CRR required this visit?  No   CCM required this visit?  No     Plan:     I have personally reviewed and noted the following in the patient's chart:   Medical and social history Use of alcohol, tobacco or illicit drugs  Current medications and supplements including opioid prescriptions. Patient  is not currently taking opioid prescriptions. Functional ability and status Nutritional status Physical activity Advanced directives List of other physicians Hospitalizations, surgeries, and ER visits in previous 12 months Vitals Screenings to include cognitive, depression, and falls Referrals and appointments  In addition, I have reviewed and discussed with patient certain preventive protocols, quality metrics, and best practice recommendations. A written personalized care plan for preventive services as well as general preventive health recommendations were provided to patient.     Marzella Schlein, LPN   72/[id number]   After Visit Summary: (MyChart) Due to this being a telephonic visit, the after visit summary with patients personalized plan was offered to patient via MyChart   Nurse Notes: none

## 2023-03-29 ENCOUNTER — Other Ambulatory Visit: Payer: Self-pay | Admitting: Urology

## 2023-05-24 ENCOUNTER — Other Ambulatory Visit: Payer: Self-pay | Admitting: Family Medicine

## 2023-05-24 DIAGNOSIS — E7800 Pure hypercholesterolemia, unspecified: Secondary | ICD-10-CM

## 2023-06-04 DIAGNOSIS — R972 Elevated prostate specific antigen [PSA]: Secondary | ICD-10-CM | POA: Diagnosis not present

## 2023-06-11 DIAGNOSIS — R3121 Asymptomatic microscopic hematuria: Secondary | ICD-10-CM | POA: Diagnosis not present

## 2023-06-11 DIAGNOSIS — N401 Enlarged prostate with lower urinary tract symptoms: Secondary | ICD-10-CM | POA: Diagnosis not present

## 2023-06-11 DIAGNOSIS — R3912 Poor urinary stream: Secondary | ICD-10-CM | POA: Diagnosis not present

## 2023-06-11 DIAGNOSIS — Z8551 Personal history of malignant neoplasm of bladder: Secondary | ICD-10-CM | POA: Diagnosis not present

## 2023-06-11 DIAGNOSIS — R972 Elevated prostate specific antigen [PSA]: Secondary | ICD-10-CM | POA: Diagnosis not present

## 2023-06-21 DIAGNOSIS — M5386 Other specified dorsopathies, lumbar region: Secondary | ICD-10-CM | POA: Diagnosis not present

## 2023-06-21 DIAGNOSIS — M9905 Segmental and somatic dysfunction of pelvic region: Secondary | ICD-10-CM | POA: Diagnosis not present

## 2023-06-21 DIAGNOSIS — M51362 Other intervertebral disc degeneration, lumbar region with discogenic back pain and lower extremity pain: Secondary | ICD-10-CM | POA: Diagnosis not present

## 2023-06-21 DIAGNOSIS — M9904 Segmental and somatic dysfunction of sacral region: Secondary | ICD-10-CM | POA: Diagnosis not present

## 2023-06-21 DIAGNOSIS — M5414 Radiculopathy, thoracic region: Secondary | ICD-10-CM | POA: Diagnosis not present

## 2023-06-21 DIAGNOSIS — M5417 Radiculopathy, lumbosacral region: Secondary | ICD-10-CM | POA: Diagnosis not present

## 2023-06-21 DIAGNOSIS — M9902 Segmental and somatic dysfunction of thoracic region: Secondary | ICD-10-CM | POA: Diagnosis not present

## 2023-06-21 DIAGNOSIS — M9903 Segmental and somatic dysfunction of lumbar region: Secondary | ICD-10-CM | POA: Diagnosis not present

## 2023-06-25 DIAGNOSIS — M9903 Segmental and somatic dysfunction of lumbar region: Secondary | ICD-10-CM | POA: Diagnosis not present

## 2023-06-25 DIAGNOSIS — M9905 Segmental and somatic dysfunction of pelvic region: Secondary | ICD-10-CM | POA: Diagnosis not present

## 2023-06-25 DIAGNOSIS — M51362 Other intervertebral disc degeneration, lumbar region with discogenic back pain and lower extremity pain: Secondary | ICD-10-CM | POA: Diagnosis not present

## 2023-06-25 DIAGNOSIS — M5417 Radiculopathy, lumbosacral region: Secondary | ICD-10-CM | POA: Diagnosis not present

## 2023-06-25 DIAGNOSIS — M9902 Segmental and somatic dysfunction of thoracic region: Secondary | ICD-10-CM | POA: Diagnosis not present

## 2023-06-25 DIAGNOSIS — M5386 Other specified dorsopathies, lumbar region: Secondary | ICD-10-CM | POA: Diagnosis not present

## 2023-06-25 DIAGNOSIS — M9904 Segmental and somatic dysfunction of sacral region: Secondary | ICD-10-CM | POA: Diagnosis not present

## 2023-06-25 DIAGNOSIS — M5414 Radiculopathy, thoracic region: Secondary | ICD-10-CM | POA: Diagnosis not present

## 2023-06-28 DIAGNOSIS — M9905 Segmental and somatic dysfunction of pelvic region: Secondary | ICD-10-CM | POA: Diagnosis not present

## 2023-06-28 DIAGNOSIS — M9903 Segmental and somatic dysfunction of lumbar region: Secondary | ICD-10-CM | POA: Diagnosis not present

## 2023-06-28 DIAGNOSIS — M5417 Radiculopathy, lumbosacral region: Secondary | ICD-10-CM | POA: Diagnosis not present

## 2023-06-28 DIAGNOSIS — M9902 Segmental and somatic dysfunction of thoracic region: Secondary | ICD-10-CM | POA: Diagnosis not present

## 2023-06-28 DIAGNOSIS — M9904 Segmental and somatic dysfunction of sacral region: Secondary | ICD-10-CM | POA: Diagnosis not present

## 2023-06-28 DIAGNOSIS — M5386 Other specified dorsopathies, lumbar region: Secondary | ICD-10-CM | POA: Diagnosis not present

## 2023-06-28 DIAGNOSIS — M51362 Other intervertebral disc degeneration, lumbar region with discogenic back pain and lower extremity pain: Secondary | ICD-10-CM | POA: Diagnosis not present

## 2023-06-28 DIAGNOSIS — M5414 Radiculopathy, thoracic region: Secondary | ICD-10-CM | POA: Diagnosis not present

## 2023-07-02 DIAGNOSIS — M51362 Other intervertebral disc degeneration, lumbar region with discogenic back pain and lower extremity pain: Secondary | ICD-10-CM | POA: Diagnosis not present

## 2023-07-02 DIAGNOSIS — M9905 Segmental and somatic dysfunction of pelvic region: Secondary | ICD-10-CM | POA: Diagnosis not present

## 2023-07-02 DIAGNOSIS — M5417 Radiculopathy, lumbosacral region: Secondary | ICD-10-CM | POA: Diagnosis not present

## 2023-07-02 DIAGNOSIS — M9903 Segmental and somatic dysfunction of lumbar region: Secondary | ICD-10-CM | POA: Diagnosis not present

## 2023-07-02 DIAGNOSIS — M5414 Radiculopathy, thoracic region: Secondary | ICD-10-CM | POA: Diagnosis not present

## 2023-07-02 DIAGNOSIS — M9904 Segmental and somatic dysfunction of sacral region: Secondary | ICD-10-CM | POA: Diagnosis not present

## 2023-07-02 DIAGNOSIS — M9902 Segmental and somatic dysfunction of thoracic region: Secondary | ICD-10-CM | POA: Diagnosis not present

## 2023-07-02 DIAGNOSIS — M5386 Other specified dorsopathies, lumbar region: Secondary | ICD-10-CM | POA: Diagnosis not present

## 2023-07-05 DIAGNOSIS — M9904 Segmental and somatic dysfunction of sacral region: Secondary | ICD-10-CM | POA: Diagnosis not present

## 2023-07-05 DIAGNOSIS — M5417 Radiculopathy, lumbosacral region: Secondary | ICD-10-CM | POA: Diagnosis not present

## 2023-07-05 DIAGNOSIS — M9905 Segmental and somatic dysfunction of pelvic region: Secondary | ICD-10-CM | POA: Diagnosis not present

## 2023-07-05 DIAGNOSIS — M9903 Segmental and somatic dysfunction of lumbar region: Secondary | ICD-10-CM | POA: Diagnosis not present

## 2023-07-05 DIAGNOSIS — M51362 Other intervertebral disc degeneration, lumbar region with discogenic back pain and lower extremity pain: Secondary | ICD-10-CM | POA: Diagnosis not present

## 2023-07-05 DIAGNOSIS — M9902 Segmental and somatic dysfunction of thoracic region: Secondary | ICD-10-CM | POA: Diagnosis not present

## 2023-07-05 DIAGNOSIS — M5414 Radiculopathy, thoracic region: Secondary | ICD-10-CM | POA: Diagnosis not present

## 2023-07-05 DIAGNOSIS — M5386 Other specified dorsopathies, lumbar region: Secondary | ICD-10-CM | POA: Diagnosis not present

## 2023-07-09 DIAGNOSIS — M9902 Segmental and somatic dysfunction of thoracic region: Secondary | ICD-10-CM | POA: Diagnosis not present

## 2023-07-09 DIAGNOSIS — M5414 Radiculopathy, thoracic region: Secondary | ICD-10-CM | POA: Diagnosis not present

## 2023-07-09 DIAGNOSIS — M9903 Segmental and somatic dysfunction of lumbar region: Secondary | ICD-10-CM | POA: Diagnosis not present

## 2023-07-09 DIAGNOSIS — M5417 Radiculopathy, lumbosacral region: Secondary | ICD-10-CM | POA: Diagnosis not present

## 2023-07-09 DIAGNOSIS — M5386 Other specified dorsopathies, lumbar region: Secondary | ICD-10-CM | POA: Diagnosis not present

## 2023-07-09 DIAGNOSIS — M9905 Segmental and somatic dysfunction of pelvic region: Secondary | ICD-10-CM | POA: Diagnosis not present

## 2023-07-09 DIAGNOSIS — M51362 Other intervertebral disc degeneration, lumbar region with discogenic back pain and lower extremity pain: Secondary | ICD-10-CM | POA: Diagnosis not present

## 2023-07-09 DIAGNOSIS — M9904 Segmental and somatic dysfunction of sacral region: Secondary | ICD-10-CM | POA: Diagnosis not present

## 2023-07-12 DIAGNOSIS — M9905 Segmental and somatic dysfunction of pelvic region: Secondary | ICD-10-CM | POA: Diagnosis not present

## 2023-07-12 DIAGNOSIS — M9904 Segmental and somatic dysfunction of sacral region: Secondary | ICD-10-CM | POA: Diagnosis not present

## 2023-07-12 DIAGNOSIS — M5417 Radiculopathy, lumbosacral region: Secondary | ICD-10-CM | POA: Diagnosis not present

## 2023-07-12 DIAGNOSIS — M9902 Segmental and somatic dysfunction of thoracic region: Secondary | ICD-10-CM | POA: Diagnosis not present

## 2023-07-12 DIAGNOSIS — M5414 Radiculopathy, thoracic region: Secondary | ICD-10-CM | POA: Diagnosis not present

## 2023-07-12 DIAGNOSIS — M51362 Other intervertebral disc degeneration, lumbar region with discogenic back pain and lower extremity pain: Secondary | ICD-10-CM | POA: Diagnosis not present

## 2023-07-12 DIAGNOSIS — M9903 Segmental and somatic dysfunction of lumbar region: Secondary | ICD-10-CM | POA: Diagnosis not present

## 2023-07-12 DIAGNOSIS — M5386 Other specified dorsopathies, lumbar region: Secondary | ICD-10-CM | POA: Diagnosis not present

## 2023-07-16 DIAGNOSIS — M5414 Radiculopathy, thoracic region: Secondary | ICD-10-CM | POA: Diagnosis not present

## 2023-07-16 DIAGNOSIS — M9903 Segmental and somatic dysfunction of lumbar region: Secondary | ICD-10-CM | POA: Diagnosis not present

## 2023-07-16 DIAGNOSIS — M5417 Radiculopathy, lumbosacral region: Secondary | ICD-10-CM | POA: Diagnosis not present

## 2023-07-16 DIAGNOSIS — M51362 Other intervertebral disc degeneration, lumbar region with discogenic back pain and lower extremity pain: Secondary | ICD-10-CM | POA: Diagnosis not present

## 2023-07-16 DIAGNOSIS — M9905 Segmental and somatic dysfunction of pelvic region: Secondary | ICD-10-CM | POA: Diagnosis not present

## 2023-07-16 DIAGNOSIS — M9902 Segmental and somatic dysfunction of thoracic region: Secondary | ICD-10-CM | POA: Diagnosis not present

## 2023-07-16 DIAGNOSIS — M9904 Segmental and somatic dysfunction of sacral region: Secondary | ICD-10-CM | POA: Diagnosis not present

## 2023-07-16 DIAGNOSIS — M5386 Other specified dorsopathies, lumbar region: Secondary | ICD-10-CM | POA: Diagnosis not present

## 2023-07-23 DIAGNOSIS — M9903 Segmental and somatic dysfunction of lumbar region: Secondary | ICD-10-CM | POA: Diagnosis not present

## 2023-07-23 DIAGNOSIS — M51362 Other intervertebral disc degeneration, lumbar region with discogenic back pain and lower extremity pain: Secondary | ICD-10-CM | POA: Diagnosis not present

## 2023-07-23 DIAGNOSIS — M5386 Other specified dorsopathies, lumbar region: Secondary | ICD-10-CM | POA: Diagnosis not present

## 2023-07-23 DIAGNOSIS — M9905 Segmental and somatic dysfunction of pelvic region: Secondary | ICD-10-CM | POA: Diagnosis not present

## 2023-07-23 DIAGNOSIS — M5414 Radiculopathy, thoracic region: Secondary | ICD-10-CM | POA: Diagnosis not present

## 2023-07-23 DIAGNOSIS — M5417 Radiculopathy, lumbosacral region: Secondary | ICD-10-CM | POA: Diagnosis not present

## 2023-07-23 DIAGNOSIS — M9902 Segmental and somatic dysfunction of thoracic region: Secondary | ICD-10-CM | POA: Diagnosis not present

## 2023-07-23 DIAGNOSIS — M9904 Segmental and somatic dysfunction of sacral region: Secondary | ICD-10-CM | POA: Diagnosis not present

## 2023-07-24 ENCOUNTER — Telehealth: Payer: Self-pay | Admitting: *Deleted

## 2023-07-24 NOTE — Telephone Encounter (Signed)
 Copied from CRM [id number]. Topic: Clinical - Request for Lab/Test Order >> Jul 24, 2023  9:29 AM Brent Butler wrote: Reason for CRM: Patient called in regarding labs, wanted to know if he needs to have his labs done before his appointment on 05/09 ? Would like a callback regarding this   Patient notified, no need to do an early appt, if blood work needed we can do on the same appt day  [facility]

## 2023-07-27 ENCOUNTER — Ambulatory Visit (INDEPENDENT_AMBULATORY_CARE_PROVIDER_SITE_OTHER): Admitting: Family Medicine

## 2023-07-27 ENCOUNTER — Encounter: Payer: Self-pay | Admitting: Family Medicine

## 2023-07-27 VITALS — BP 175/84 | HR 81 | Temp 97.5°F | Ht 67.0 in | Wt 202.0 lb

## 2023-07-27 DIAGNOSIS — E7800 Pure hypercholesterolemia, unspecified: Secondary | ICD-10-CM | POA: Diagnosis not present

## 2023-07-27 DIAGNOSIS — M5440 Lumbago with sciatica, unspecified side: Secondary | ICD-10-CM | POA: Insufficient documentation

## 2023-07-27 DIAGNOSIS — E785 Hyperlipidemia, unspecified: Secondary | ICD-10-CM | POA: Diagnosis not present

## 2023-07-27 DIAGNOSIS — R030 Elevated blood-pressure reading, without diagnosis of hypertension: Secondary | ICD-10-CM | POA: Diagnosis not present

## 2023-07-27 DIAGNOSIS — N400 Enlarged prostate without lower urinary tract symptoms: Secondary | ICD-10-CM

## 2023-07-27 DIAGNOSIS — R7303 Prediabetes: Secondary | ICD-10-CM

## 2023-07-27 LAB — COMPREHENSIVE METABOLIC PANEL WITH GFR
ALT: 21 U/L (ref 0–53)
AST: 21 U/L (ref 0–37)
Albumin: 4.4 g/dL (ref 3.5–5.2)
Alkaline Phosphatase: 70 U/L (ref 39–117)
BUN: 17 mg/dL (ref 6–23)
CO2: 27 meq/L (ref 19–32)
Calcium: 9.1 mg/dL (ref 8.4–10.5)
Chloride: 104 meq/L (ref 96–112)
Creatinine, Ser: 1.07 mg/dL (ref 0.40–1.50)
GFR: 66.5 mL/min (ref >60.00)
Glucose, Bld: 96 mg/dL (ref 70–99)
Potassium: 4.1 meq/L (ref 3.5–5.1)
Sodium: 139 meq/L (ref 135–145)
Total Bilirubin: 0.7 mg/dL (ref 0.2–1.2)
Total Protein: 7.1 g/dL (ref 6.0–8.3)

## 2023-07-27 LAB — CBC
HCT: 42.1 % (ref 39.0–52.0)
Hemoglobin: 14.4 g/dL (ref 13.0–17.0)
MCHC: 34.2 g/dL (ref 30.0–36.0)
MCV: 95.5 fl (ref 78.0–100.0)
Platelets: 162 10*3/uL (ref 150.0–400.0)
RBC: 4.41 Mil/uL (ref 4.22–5.81)
RDW: 14.2 % (ref 11.5–15.5)
WBC: 5 10*3/uL (ref 4.0–10.5)

## 2023-07-27 LAB — LIPID PANEL
Cholesterol: 113 mg/dL (ref 0–200)
HDL: 33.8 mg/dL — ABNORMAL LOW (ref >39.00)
LDL Cholesterol: 55 mg/dL (ref 0–99)
NonHDL: 79.36
Total CHOL/HDL Ratio: 3
Triglycerides: 122 mg/dL (ref 0.0–149.0)
VLDL: 24.4 mg/dL (ref 0.0–40.0)

## 2023-07-27 LAB — TSH: TSH: 0.7 u[IU]/mL (ref 0.35–5.50)

## 2023-07-27 LAB — HEMOGLOBIN A1C: Hgb A1c MFr Bld: 5.9 % (ref 4.6–6.5)

## 2023-07-27 MED ORDER — SIMVASTATIN 20 MG PO TABS: 20.0000 mg | ORAL_TABLET | Freq: Every evening | ORAL | 3 refills | Status: DC

## 2023-07-27 MED ORDER — MELOXICAM 15 MG PO TABS: 15.0000 mg | ORAL_TABLET | Freq: Every day | ORAL | 0 refills | Status: AC

## 2023-07-27 NOTE — Progress Notes (Signed)
 Blood work is all stable.  Do not need to make any changes to treatment plan.  He should keep up the great work and we can recheck again in a year.

## 2023-07-27 NOTE — Patient Instructions (Signed)
 It was very nice to see you today!  Please try the meloxicam for your sciatica.  Let us  know if you would like a referral to see an orthopedist or physical therapist.  Please monitor your blood pressure at home and let us  know if persistently elevated.  Will check blood work today.  Please continue to work on diet and exercise.  We will see you back in year for your next annual checkup with labs.  Please come back sooner if needed.  Return in about 1 year (around 07/26/2024) for Annual Physical.   Take care, Dr Daneil Dunker  PLEASE NOTE:  If you had any lab tests, please let us  know if you have not heard back within a few days. You may see your results on mychart before we have a chance to review them but we will give you a call once they are reviewed by us .   If we ordered any referrals today, please let us  know if you have not heard from their office within the next week.   If you had any urgent prescriptions sent in today, please check with the pharmacy within an hour of our visit to make sure the prescription was transmitted appropriately.   Please try these tips to maintain a healthy lifestyle:  Eat at least 3 REAL meals and 1-2 snacks per day.  Aim for no more than 5 hours between eating.  If you eat breakfast, please do so within one hour of getting up.   Each meal should contain half fruits/vegetables, one quarter protein, and one quarter carbs (no bigger than a computer mouse)  Cut down on sweet beverages. This includes juice, soda, and sweet tea.   Drink at least 1 glass of water  with each meal and aim for at least 8 glasses per day  Exercise at least 150 minutes every week.    Preventive Care 47 Years and Older, Male Preventive care refers to lifestyle choices and visits with your health care provider that can promote health and wellness. Preventive care visits are also called wellness exams. What can I expect for my preventive care visit? Counseling During your preventive  care visit, your health care provider may ask about your: Medical history, including: Past medical problems. Family medical history. History of falls. Current health, including: Emotional well-being. Home life and relationship well-being. Sexual activity. Memory and ability to understand (cognition). Lifestyle, including: Alcohol, nicotine or tobacco, and drug use. Access to firearms. Diet, exercise, and sleep habits. Work and work Astronomer. Sunscreen use. Safety issues such as seatbelt and bike helmet use. Physical exam Your health care provider will check your: Height and weight. These may be used to calculate your BMI (body mass index). BMI is a measurement that tells if you are at a healthy weight. Waist circumference. This measures the distance around your waistline. This measurement also tells if you are at a healthy weight and may help predict your risk of certain diseases, such as type 2 diabetes and high blood pressure. Heart rate and blood pressure. Body temperature. Skin for abnormal spots. What immunizations do I need?  Vaccines are usually given at various ages, according to a schedule. Your health care provider will recommend vaccines for you based on your age, medical history, and lifestyle or other factors, such as travel or where you work. What tests do I need? Screening Your health care provider may recommend screening tests for certain conditions. This may include: Lipid and cholesterol levels. Diabetes screening. This is done by  checking your blood sugar (glucose) after you have not eaten for a while (fasting). Hepatitis C test. Hepatitis B test. HIV (human immunodeficiency virus) test. STI (sexually transmitted infection) testing, if you are at risk. Lung cancer screening. Colorectal cancer screening. Prostate cancer screening. Abdominal aortic aneurysm (AAA) screening. You may need this if you are a current or former smoker. Talk with your health care  provider about your test results, treatment options, and if necessary, the need for more tests. Follow these instructions at home: Eating and drinking  Eat a diet that includes fresh fruits and vegetables, whole grains, lean protein, and low-fat dairy products. Limit your intake of foods with high amounts of sugar, saturated fats, and salt. Take vitamin and mineral supplements as recommended by your health care provider. Do not drink alcohol if your health care provider tells you not to drink. If you drink alcohol: Limit how much you have to 0-2 drinks a day. Know how much alcohol is in your drink. In the [place] one drink equals one 12 oz bottle of beer (355 mL), one 5 oz glass of wine (148 mL), or one 1 oz glass of hard liquor (44 mL). Lifestyle Brush your teeth every morning and night with fluoride toothpaste. Floss one time each day. Exercise for at least 30 minutes 5 or more days each week. Do not use any products that contain nicotine or tobacco. These products include cigarettes, chewing tobacco, and vaping devices, such as e-cigarettes. If you need help quitting, ask your health care provider. Do not use drugs. If you are sexually active, practice safe sex. Use a condom or other form of protection to prevent STIs. Take aspirin only as told by your health care provider. Make sure that you understand how much to take and what form to take. Work with your health care provider to find out whether it is safe and beneficial for you to take aspirin daily. Ask your health care provider if you need to take a cholesterol-lowering medicine (statin). Find healthy ways to manage stress, such as: Meditation, yoga, or listening to music. Journaling. Talking to a trusted person. Spending time with friends and family. Safety Always wear your seat belt while driving or riding in a vehicle. Do not drive: If you have been drinking alcohol. Do not ride with someone who has been drinking. When you are  tired or distracted. While texting. If you have been using any mind-altering substances or drugs. Wear a helmet and other protective equipment during sports activities. If you have firearms in your house, make sure you follow all gun safety procedures. Minimize exposure to UV radiation to reduce your risk of skin cancer. What's next? Visit your health care provider once a year for an annual wellness visit. Ask your health care provider how often you should have your eyes and teeth checked. Stay up to date on all vaccines. This information is not intended to replace advice given to you by your health care provider. Make sure you discuss any questions you have with your health care provider. Document Revised: 09/01/2020 Document Reviewed: 09/01/2020 Elsevier Patient Education  2024 [employer].

## 2023-07-27 NOTE — Assessment & Plan Note (Addendum)
 Elevated today though typically well-controlled at home.  Typically home readings are in the 120s to 130s systolic.  He will continue to monitor at home and let us  know if persistently elevated.  Check labs today.

## 2023-07-27 NOTE — Assessment & Plan Note (Signed)
 Continue per urology.  Currently on Cardura, Proscar, and sildenafil.

## 2023-07-27 NOTE — Assessment & Plan Note (Signed)
 Check A1c.  He is working on lifestyle interventions.

## 2023-07-27 NOTE — Progress Notes (Signed)
   Brent Butler is a 78 y.o. male who presents today for an office visit.  Assessment/Plan:  Chronic Problems Addressed Today: Low back pain with sciatica Currently seeing chiropractor for this.  Symptoms do seem to be improving with their adjustments.  We did discuss additional treatment options including anti-inflammatories, referral to physical therapy, as well as referral to see a specialist such as sports medicine or orthopedics.  Also did discuss that he would likely benefit from an MRI at this point given that this has been a recurrent issue for the last few years.  Patient would like to finish his course of treatment with his current chiropractor before having any other referrals at this point however he is interested in trying an anti-inflammatory.  He has had some good success with Aleve.  Will start meloxicam 15 mg daily for the next 1 to 2 weeks.  He will follow-up with us  in a few weeks via MyChart to let us  know how he is doing with this.  If has continued recurrence or symptoms do not improve as above would consider referral and/or imaging.  White coat syndrome without hypertension Elevated today though typically well-controlled at home.  Typically home readings are in the 120s to 130s systolic.  He will continue to monitor at home and let us  know if persistently elevated.  Check labs today.  Dyslipidemia Check lipids.  He is doing great job with diet and exercise.  He is on simvastatin  20 mg daily.  Prediabetes Check A1c.  He is working on lifestyle interventions.  BPH (benign prostatic hyperplasia) Continue per urology.  Currently on Cardura, Proscar, and sildenafil.     Subjective:  HPI:  See A/P for status of chronic conditions.  Patient here today for annual exam.  Last seen here a year ago.  At her last visit he had labs which showed A1c of 6.0 but were otherwise stable.  Since her last visit he has been dealing with sciatica on his left side.  This is actually been  an issue that has been intermittent for the last few years.  He is currently following with a chiropractor for this.  Reportedly had x-rays performed as well which did not show any significant changes.  He has seen chiropractor for this with previous flareups over the last 2 years as well.  Symptoms do seem to be improving.  Predominately located in back part of his left leg.  Occasionally takes Aleve which does help.  Overall pain is manageable.  Otherwise he feels like he is doing well.  He is still walking daily when able this has been somewhat limited due to his above sciatica issues.  Overall try to eat a healthy diet with plenty of fruits and vegetables.  Tolerating current medications well without any significant side effects.        Objective:  Physical Exam: BP (!) 175/84   Pulse 81   Temp (!) 97.5 F (36.4 C) (Temporal)   Ht 5\' 7"  (1.702 m)   Wt 202 lb (91.6 kg)   SpO2 95%   BMI 31.64 kg/m   Gen: No acute distress, resting comfortably CV: Regular rate and rhythm with no murmurs appreciated Pulm: Normal work of breathing, clear to auscultation bilaterally with no crackles, wheezes, or rhonchi Neuro: Grossly normal, moves all extremities Psych: Normal affect and thought content      [name] M. Daneil Dunker, MD 07/27/2023 9:19 AM

## 2023-07-27 NOTE — Assessment & Plan Note (Signed)
 Currently seeing chiropractor for this.  Symptoms do seem to be improving with their adjustments.  We did discuss additional treatment options including anti-inflammatories, referral to physical therapy, as well as referral to see a specialist such as sports medicine or orthopedics.  Also did discuss that he would likely benefit from an MRI at this point given that this has been a recurrent issue for the last few years.  Patient would like to finish his course of treatment with his current chiropractor before having any other referrals at this point however he is interested in trying an anti-inflammatory.  He has had some good success with Aleve.  Will start meloxicam 15 mg daily for the next 1 to 2 weeks.  He will follow-up with us  in a few weeks via MyChart to let us  know how he is doing with this.  If has continued recurrence or symptoms do not improve as above would consider referral and/or imaging.

## 2023-07-27 NOTE — Assessment & Plan Note (Signed)
 Check lipids.  He is doing great job with diet and exercise.  He is on simvastatin  20 mg daily.

## 2023-07-28 ENCOUNTER — Other Ambulatory Visit: Payer: Self-pay | Admitting: Family Medicine

## 2023-07-28 DIAGNOSIS — E7800 Pure hypercholesterolemia, unspecified: Secondary | ICD-10-CM

## 2023-07-30 ENCOUNTER — Telehealth: Payer: Self-pay | Admitting: *Deleted

## 2023-07-30 ENCOUNTER — Other Ambulatory Visit: Payer: Self-pay | Admitting: *Deleted

## 2023-07-30 DIAGNOSIS — M5414 Radiculopathy, thoracic region: Secondary | ICD-10-CM | POA: Diagnosis not present

## 2023-07-30 DIAGNOSIS — M9902 Segmental and somatic dysfunction of thoracic region: Secondary | ICD-10-CM | POA: Diagnosis not present

## 2023-07-30 DIAGNOSIS — M5417 Radiculopathy, lumbosacral region: Secondary | ICD-10-CM | POA: Diagnosis not present

## 2023-07-30 DIAGNOSIS — M5386 Other specified dorsopathies, lumbar region: Secondary | ICD-10-CM | POA: Diagnosis not present

## 2023-07-30 DIAGNOSIS — M9905 Segmental and somatic dysfunction of pelvic region: Secondary | ICD-10-CM | POA: Diagnosis not present

## 2023-07-30 DIAGNOSIS — M9903 Segmental and somatic dysfunction of lumbar region: Secondary | ICD-10-CM | POA: Diagnosis not present

## 2023-07-30 DIAGNOSIS — M51362 Other intervertebral disc degeneration, lumbar region with discogenic back pain and lower extremity pain: Secondary | ICD-10-CM | POA: Diagnosis not present

## 2023-07-30 DIAGNOSIS — M9904 Segmental and somatic dysfunction of sacral region: Secondary | ICD-10-CM | POA: Diagnosis not present

## 2023-07-30 DIAGNOSIS — E7800 Pure hypercholesterolemia, unspecified: Secondary | ICD-10-CM

## 2023-07-30 MED ORDER — SIMVASTATIN 20 MG PO TABS: 20.0000 mg | ORAL_TABLET | Freq: Every evening | ORAL | 3 refills | Status: AC

## 2023-07-30 NOTE — Telephone Encounter (Signed)
 Copied from CRM [id number]. Topic: Clinical - Prescription Issue >> Jul 27, 2023  2:54 PM Jenice Mitts wrote: Reason for CRM: simvastatin  (ZOCOR ) 20 MG tablet  Patient is calling because his prescription should have been sent to [facility] and not [facility] because of pricing . He has already cancelled the prescription at [facility]   Rx send to [facility]

## 2023-08-09 DIAGNOSIS — M5414 Radiculopathy, thoracic region: Secondary | ICD-10-CM | POA: Diagnosis not present

## 2023-08-09 DIAGNOSIS — M51362 Other intervertebral disc degeneration, lumbar region with discogenic back pain and lower extremity pain: Secondary | ICD-10-CM | POA: Diagnosis not present

## 2023-08-09 DIAGNOSIS — M9904 Segmental and somatic dysfunction of sacral region: Secondary | ICD-10-CM | POA: Diagnosis not present

## 2023-08-09 DIAGNOSIS — M9902 Segmental and somatic dysfunction of thoracic region: Secondary | ICD-10-CM | POA: Diagnosis not present

## 2023-08-09 DIAGNOSIS — M5386 Other specified dorsopathies, lumbar region: Secondary | ICD-10-CM | POA: Diagnosis not present

## 2023-08-09 DIAGNOSIS — M9905 Segmental and somatic dysfunction of pelvic region: Secondary | ICD-10-CM | POA: Diagnosis not present

## 2023-08-09 DIAGNOSIS — M9903 Segmental and somatic dysfunction of lumbar region: Secondary | ICD-10-CM | POA: Diagnosis not present

## 2023-08-09 DIAGNOSIS — M5417 Radiculopathy, lumbosacral region: Secondary | ICD-10-CM | POA: Diagnosis not present

## 2023-08-15 DIAGNOSIS — M5386 Other specified dorsopathies, lumbar region: Secondary | ICD-10-CM | POA: Diagnosis not present

## 2023-08-15 DIAGNOSIS — M9904 Segmental and somatic dysfunction of sacral region: Secondary | ICD-10-CM | POA: Diagnosis not present

## 2023-08-15 DIAGNOSIS — M5414 Radiculopathy, thoracic region: Secondary | ICD-10-CM | POA: Diagnosis not present

## 2023-08-15 DIAGNOSIS — M9902 Segmental and somatic dysfunction of thoracic region: Secondary | ICD-10-CM | POA: Diagnosis not present

## 2023-08-15 DIAGNOSIS — M9903 Segmental and somatic dysfunction of lumbar region: Secondary | ICD-10-CM | POA: Diagnosis not present

## 2023-08-15 DIAGNOSIS — M51362 Other intervertebral disc degeneration, lumbar region with discogenic back pain and lower extremity pain: Secondary | ICD-10-CM | POA: Diagnosis not present

## 2023-08-15 DIAGNOSIS — M9905 Segmental and somatic dysfunction of pelvic region: Secondary | ICD-10-CM | POA: Diagnosis not present

## 2023-08-15 DIAGNOSIS — M5417 Radiculopathy, lumbosacral region: Secondary | ICD-10-CM | POA: Diagnosis not present

## 2023-08-29 DIAGNOSIS — J343 Hypertrophy of nasal turbinates: Secondary | ICD-10-CM | POA: Diagnosis not present

## 2023-09-14 DIAGNOSIS — J3489 Other specified disorders of nose and nasal sinuses: Secondary | ICD-10-CM | POA: Diagnosis not present

## 2023-09-14 DIAGNOSIS — J329 Chronic sinusitis, unspecified: Secondary | ICD-10-CM | POA: Diagnosis not present

## 2023-09-14 DIAGNOSIS — J343 Hypertrophy of nasal turbinates: Secondary | ICD-10-CM | POA: Diagnosis not present

## 2023-10-02 DIAGNOSIS — J330 Polyp of nasal cavity: Secondary | ICD-10-CM | POA: Diagnosis not present

## 2023-10-02 DIAGNOSIS — J339 Nasal polyp, unspecified: Secondary | ICD-10-CM | POA: Diagnosis not present

## 2023-10-31 DIAGNOSIS — H5203 Hypermetropia, bilateral: Secondary | ICD-10-CM | POA: Diagnosis not present

## 2023-10-31 DIAGNOSIS — H52203 Unspecified astigmatism, bilateral: Secondary | ICD-10-CM | POA: Diagnosis not present

## 2023-10-31 DIAGNOSIS — H524 Presbyopia: Secondary | ICD-10-CM | POA: Diagnosis not present

## 2023-10-31 DIAGNOSIS — H26493 Other secondary cataract, bilateral: Secondary | ICD-10-CM | POA: Diagnosis not present

## 2023-11-22 DIAGNOSIS — M9901 Segmental and somatic dysfunction of cervical region: Secondary | ICD-10-CM | POA: Diagnosis not present

## 2023-11-22 DIAGNOSIS — S161XXA Strain of muscle, fascia and tendon at neck level, initial encounter: Secondary | ICD-10-CM | POA: Diagnosis not present

## 2023-11-22 DIAGNOSIS — M9902 Segmental and somatic dysfunction of thoracic region: Secondary | ICD-10-CM | POA: Diagnosis not present

## 2023-11-22 DIAGNOSIS — M5414 Radiculopathy, thoracic region: Secondary | ICD-10-CM | POA: Diagnosis not present

## 2023-11-26 DIAGNOSIS — M5414 Radiculopathy, thoracic region: Secondary | ICD-10-CM | POA: Diagnosis not present

## 2023-11-26 DIAGNOSIS — M9901 Segmental and somatic dysfunction of cervical region: Secondary | ICD-10-CM | POA: Diagnosis not present

## 2023-11-26 DIAGNOSIS — S161XXA Strain of muscle, fascia and tendon at neck level, initial encounter: Secondary | ICD-10-CM | POA: Diagnosis not present

## 2023-11-26 DIAGNOSIS — M9902 Segmental and somatic dysfunction of thoracic region: Secondary | ICD-10-CM | POA: Diagnosis not present

## 2023-11-29 DIAGNOSIS — M5414 Radiculopathy, thoracic region: Secondary | ICD-10-CM | POA: Diagnosis not present

## 2023-11-29 DIAGNOSIS — M9901 Segmental and somatic dysfunction of cervical region: Secondary | ICD-10-CM | POA: Diagnosis not present

## 2023-11-29 DIAGNOSIS — S161XXA Strain of muscle, fascia and tendon at neck level, initial encounter: Secondary | ICD-10-CM | POA: Diagnosis not present

## 2023-11-29 DIAGNOSIS — M9902 Segmental and somatic dysfunction of thoracic region: Secondary | ICD-10-CM | POA: Diagnosis not present

## 2023-12-03 DIAGNOSIS — N401 Enlarged prostate with lower urinary tract symptoms: Secondary | ICD-10-CM | POA: Diagnosis not present

## 2023-12-10 DIAGNOSIS — Z8551 Personal history of malignant neoplasm of bladder: Secondary | ICD-10-CM | POA: Diagnosis not present

## 2023-12-10 DIAGNOSIS — R972 Elevated prostate specific antigen [PSA]: Secondary | ICD-10-CM | POA: Diagnosis not present

## 2023-12-10 DIAGNOSIS — N210 Calculus in bladder: Secondary | ICD-10-CM | POA: Diagnosis not present

## 2023-12-10 DIAGNOSIS — R350 Frequency of micturition: Secondary | ICD-10-CM | POA: Diagnosis not present

## 2023-12-10 DIAGNOSIS — N5201 Erectile dysfunction due to arterial insufficiency: Secondary | ICD-10-CM | POA: Diagnosis not present

## 2023-12-10 DIAGNOSIS — R3912 Poor urinary stream: Secondary | ICD-10-CM | POA: Diagnosis not present

## 2023-12-10 DIAGNOSIS — N200 Calculus of kidney: Secondary | ICD-10-CM | POA: Diagnosis not present

## 2023-12-10 DIAGNOSIS — N401 Enlarged prostate with lower urinary tract symptoms: Secondary | ICD-10-CM | POA: Diagnosis not present

## 2023-12-10 DIAGNOSIS — R3121 Asymptomatic microscopic hematuria: Secondary | ICD-10-CM | POA: Diagnosis not present

## 2023-12-25 DIAGNOSIS — Z23 Encounter for immunization: Secondary | ICD-10-CM | POA: Diagnosis not present

## 2024-01-01 DIAGNOSIS — K5730 Diverticulosis of large intestine without perforation or abscess without bleeding: Secondary | ICD-10-CM | POA: Diagnosis not present

## 2024-01-01 DIAGNOSIS — C679 Malignant neoplasm of bladder, unspecified: Secondary | ICD-10-CM | POA: Diagnosis not present

## 2024-01-01 DIAGNOSIS — R3121 Asymptomatic microscopic hematuria: Secondary | ICD-10-CM | POA: Diagnosis not present

## 2024-01-01 DIAGNOSIS — N400 Enlarged prostate without lower urinary tract symptoms: Secondary | ICD-10-CM | POA: Diagnosis not present

## 2024-01-21 DIAGNOSIS — R350 Frequency of micturition: Secondary | ICD-10-CM | POA: Diagnosis not present

## 2024-01-21 DIAGNOSIS — R3121 Asymptomatic microscopic hematuria: Secondary | ICD-10-CM | POA: Diagnosis not present

## 2024-01-21 DIAGNOSIS — N401 Enlarged prostate with lower urinary tract symptoms: Secondary | ICD-10-CM | POA: Diagnosis not present

## 2024-01-21 DIAGNOSIS — N5201 Erectile dysfunction due to arterial insufficiency: Secondary | ICD-10-CM | POA: Diagnosis not present

## 2024-01-21 DIAGNOSIS — N210 Calculus in bladder: Secondary | ICD-10-CM | POA: Diagnosis not present

## 2024-01-21 DIAGNOSIS — R3912 Poor urinary stream: Secondary | ICD-10-CM | POA: Diagnosis not present

## 2024-01-21 DIAGNOSIS — R3914 Feeling of incomplete bladder emptying: Secondary | ICD-10-CM | POA: Diagnosis not present

## 2024-07-29 ENCOUNTER — Ambulatory Visit: Admitting: Family Medicine
